# Patient Record
Sex: Male | Born: 1960 | Race: White | Hispanic: No | Marital: Married | Smoking: Former smoker
Health system: Southern US, Community
[De-identification: ages and names within clinical notes are randomized; demographics above are authoritative.]

## PROBLEM LIST (undated history)

## (undated) DIAGNOSIS — R0902 Hypoxemia: Secondary | ICD-10-CM

## (undated) DIAGNOSIS — J679 Hypersensitivity pneumonitis due to unspecified organic dust: Secondary | ICD-10-CM

## (undated) DIAGNOSIS — R079 Chest pain, unspecified: Secondary | ICD-10-CM

## (undated) DIAGNOSIS — J302 Other seasonal allergic rhinitis: Secondary | ICD-10-CM

## (undated) DIAGNOSIS — I10 Essential (primary) hypertension: Secondary | ICD-10-CM

## (undated) DIAGNOSIS — R011 Cardiac murmur, unspecified: Secondary | ICD-10-CM

## (undated) DIAGNOSIS — I341 Nonrheumatic mitral (valve) prolapse: Secondary | ICD-10-CM

## (undated) DIAGNOSIS — J8409 Other alveolar and parieto-alveolar conditions: Secondary | ICD-10-CM

## (undated) DIAGNOSIS — E781 Pure hyperglyceridemia: Secondary | ICD-10-CM

## (undated) DIAGNOSIS — H9313 Tinnitus, bilateral: Secondary | ICD-10-CM

## (undated) HISTORY — DX: Hypoxemia: R09.02

## (undated) HISTORY — DX: Tinnitus, bilateral: H93.13

## (undated) HISTORY — DX: Hypersensitivity pneumonitis due to unspecified organic dust: J67.9

## (undated) HISTORY — DX: Essential (primary) hypertension: I10

## (undated) HISTORY — DX: Pure hyperglyceridemia: E78.1

## (undated) HISTORY — DX: Nonrheumatic mitral (valve) prolapse: I34.1

## (undated) HISTORY — DX: Cardiac murmur, unspecified: R01.1

## (undated) HISTORY — DX: Chest pain, unspecified: R07.9

## (undated) HISTORY — DX: Other alveolar and parieto-alveolar conditions: J84.09

## (undated) HISTORY — DX: Other seasonal allergic rhinitis: J30.2

## (undated) HISTORY — PX: BACK SURGERY: SHX140

## (undated) HISTORY — PX: SHOULDER SURGERY: SHX246

---

## 1997-12-06 ENCOUNTER — Emergency Department (HOSPITAL_COMMUNITY): Admission: EM | Admit: 1997-12-06 | Discharge: 1997-12-06 | Payer: Self-pay | Admitting: Emergency Medicine

## 1997-12-08 ENCOUNTER — Ambulatory Visit (HOSPITAL_COMMUNITY): Admission: RE | Admit: 1997-12-08 | Discharge: 1997-12-08 | Payer: Self-pay | Admitting: Family Medicine

## 2001-04-30 ENCOUNTER — Other Ambulatory Visit: Admission: RE | Admit: 2001-04-30 | Discharge: 2001-04-30 | Payer: Self-pay | Admitting: Family Medicine

## 2002-05-10 ENCOUNTER — Encounter: Admission: RE | Admit: 2002-05-10 | Discharge: 2002-05-10 | Payer: Self-pay | Admitting: Family Medicine

## 2002-05-10 ENCOUNTER — Encounter: Payer: Self-pay | Admitting: Family Medicine

## 2010-07-05 ENCOUNTER — Emergency Department (HOSPITAL_COMMUNITY): Payer: 59

## 2010-07-05 ENCOUNTER — Inpatient Hospital Stay (HOSPITAL_COMMUNITY)
Admission: EM | Admit: 2010-07-05 | Discharge: 2010-07-09 | DRG: 198 | Disposition: A | Discharge status: Home / Self Care | Payer: 59 | Attending: Family Medicine | Admitting: Family Medicine

## 2010-07-05 DIAGNOSIS — I501 Left ventricular failure: Secondary | ICD-10-CM

## 2010-07-05 DIAGNOSIS — T7840XA Allergy, unspecified, initial encounter: Secondary | ICD-10-CM | POA: Diagnosis present

## 2010-07-05 DIAGNOSIS — Z418 Encounter for other procedures for purposes other than remedying health state: Secondary | ICD-10-CM

## 2010-07-05 DIAGNOSIS — Z2989 Encounter for other specified prophylactic measures: Secondary | ICD-10-CM

## 2010-07-05 DIAGNOSIS — Z79899 Other long term (current) drug therapy: Secondary | ICD-10-CM

## 2010-07-05 DIAGNOSIS — X58XXXA Exposure to other specified factors, initial encounter: Secondary | ICD-10-CM

## 2010-07-05 DIAGNOSIS — J984 Other disorders of lung: Secondary | ICD-10-CM | POA: Diagnosis present

## 2010-07-05 DIAGNOSIS — R0902 Hypoxemia: Secondary | ICD-10-CM | POA: Diagnosis present

## 2010-07-05 DIAGNOSIS — E781 Pure hyperglyceridemia: Secondary | ICD-10-CM

## 2010-07-05 DIAGNOSIS — J8409 Other alveolar and parieto-alveolar conditions: Principal | ICD-10-CM | POA: Diagnosis present

## 2010-07-05 DIAGNOSIS — R52 Pain, unspecified: Secondary | ICD-10-CM

## 2010-07-05 DIAGNOSIS — D72829 Elevated white blood cell count, unspecified: Secondary | ICD-10-CM | POA: Diagnosis present

## 2010-07-05 LAB — POCT I-STAT, CHEM 8
BUN: 22 mg/dL (ref 6–23)
Calcium, Ion: 1.02 mmol/L — ABNORMAL LOW (ref 1.12–1.32)
Chloride: 110 mEq/L (ref 96–112)
Creatinine, Ser: 1.4 mg/dL (ref 0.4–1.5)
Glucose, Bld: 94 mg/dL (ref 70–99)
HCT: 41 % (ref 39.0–52.0)
Hemoglobin: 13.9 g/dL (ref 13.0–17.0)
Potassium: 4 mEq/L (ref 3.5–5.1)
Sodium: 138 mEq/L (ref 135–145)
TCO2: 20 mmol/L (ref 0–100)

## 2010-07-05 LAB — URINALYSIS, ROUTINE W REFLEX MICROSCOPIC
Bilirubin Urine: NEGATIVE
Glucose, UA: NEGATIVE mg/dL
Hgb urine dipstick: NEGATIVE
Protein, ur: NEGATIVE mg/dL
Urobilinogen, UA: 0.2 mg/dL (ref 0.0–1.0)

## 2010-07-05 LAB — DIFFERENTIAL
Basophils Absolute: 0 10*3/uL (ref 0.0–0.1)
Basophils Relative: 0 % (ref 0–1)
Eosinophils Absolute: 0.1 10*3/uL (ref 0.0–0.7)
Eosinophils Relative: 1 % (ref 0–5)
Lymphocytes Relative: 9 % — ABNORMAL LOW (ref 12–46)
Lymphs Abs: 1.5 10*3/uL (ref 0.7–4.0)
Monocytes Absolute: 0.7 10*3/uL (ref 0.1–1.0)
Monocytes Relative: 5 % (ref 3–12)
Neutro Abs: 14.1 10*3/uL — ABNORMAL HIGH (ref 1.7–7.7)
Neutrophils Relative %: 85 % — ABNORMAL HIGH (ref 43–77)

## 2010-07-05 LAB — CBC
HCT: 39.4 % (ref 39.0–52.0)
Hemoglobin: 13.5 g/dL (ref 13.0–17.0)
MCH: 29 pg (ref 26.0–34.0)
MCHC: 34.3 g/dL (ref 30.0–36.0)
MCV: 84.7 fL (ref 78.0–100.0)
Platelets: 241 10*3/uL (ref 150–400)
RBC: 4.65 MIL/uL (ref 4.22–5.81)
RDW: 13.6 % (ref 11.5–15.5)
WBC: 16.5 10*3/uL — ABNORMAL HIGH (ref 4.0–10.5)

## 2010-07-05 LAB — TROPONIN I: Troponin I: 0.01 ng/mL (ref 0.00–0.06)

## 2010-07-05 LAB — SEDIMENTATION RATE: Sed Rate: 32 mm/hr — ABNORMAL HIGH (ref 0–16)

## 2010-07-05 LAB — POCT CARDIAC MARKERS
CKMB, poc: <1 ng/mL — ABNORMAL LOW (ref 1.0–8.0)
Myoglobin, poc: 107 ng/mL (ref 12–200)
Troponin i, poc: <0.05 ng/mL (ref 0.00–0.09)

## 2010-07-05 LAB — PROTIME-INR
INR: 1.02 (ref 0.00–1.49)
Prothrombin Time: 13.6 seconds (ref 11.6–15.2)

## 2010-07-05 LAB — CK TOTAL AND CKMB (NOT AT ARMC): CK, MB: 0.9 ng/mL (ref 0.3–4.0)

## 2010-07-05 LAB — BRAIN NATRIURETIC PEPTIDE: Pro B Natriuretic peptide (BNP): 39 pg/mL (ref 0.0–100.0)

## 2010-07-05 MED ORDER — IOHEXOL 300 MG/ML  SOLN
100.0000 mL | Freq: Once | INTRAMUSCULAR | Status: AC | PRN
  Administered 2010-07-05: 100 mL via INTRAVENOUS

## 2010-07-06 ENCOUNTER — Inpatient Hospital Stay (HOSPITAL_COMMUNITY): Payer: 59

## 2010-07-06 DIAGNOSIS — R0602 Shortness of breath: Secondary | ICD-10-CM

## 2010-07-06 DIAGNOSIS — J984 Other disorders of lung: Secondary | ICD-10-CM

## 2010-07-06 LAB — CBC
HCT: 39.2 % (ref 39.0–52.0)
MCHC: 33.2 g/dL (ref 30.0–36.0)
MCV: 84.3 fL (ref 78.0–100.0)
Platelets: 228 10*3/uL (ref 150–400)
RDW: 13.5 % (ref 11.5–15.5)
WBC: 7.2 10*3/uL (ref 4.0–10.5)

## 2010-07-06 LAB — CARDIAC PANEL(CRET KIN+CKTOT+MB+TROPI)
CK, MB: 0.8 ng/mL (ref 0.3–4.0)
CK, MB: 1.2 ng/mL (ref 0.3–4.0)
Total CK: 93 U/L (ref 7–232)
Troponin I: 0.01 ng/mL (ref 0.00–0.06)

## 2010-07-06 LAB — BASIC METABOLIC PANEL
BUN: 22 mg/dL (ref 6–23)
Calcium: 9.1 mg/dL (ref 8.4–10.5)
GFR calc non Af Amer: 50 mL/min — ABNORMAL LOW (ref >60)
Glucose, Bld: 102 mg/dL — ABNORMAL HIGH (ref 70–99)
Potassium: 3.7 mEq/L (ref 3.5–5.1)
Sodium: 140 mEq/L (ref 135–145)

## 2010-07-06 LAB — TSH: TSH: 0.685 u[IU]/mL (ref 0.350–4.500)

## 2010-07-06 LAB — PROCALCITONIN: Procalcitonin: <0.1 ng/mL

## 2010-07-06 LAB — RHEUMATOID FACTOR: Rhuematoid fact SerPl-aCnc: <10 IU/mL (ref <=14)

## 2010-07-07 ENCOUNTER — Inpatient Hospital Stay (HOSPITAL_COMMUNITY): Payer: 59

## 2010-07-07 LAB — CBC
HCT: 41.6 % (ref 39.0–52.0)
MCH: 28.7 pg (ref 26.0–34.0)
MCHC: 33.7 g/dL (ref 30.0–36.0)
MCV: 85.2 fL (ref 78.0–100.0)
Platelets: 236 10*3/uL (ref 150–400)
RDW: 13.4 % (ref 11.5–15.5)

## 2010-07-07 LAB — BASIC METABOLIC PANEL
CO2: 31 mEq/L (ref 19–32)
Chloride: 101 mEq/L (ref 96–112)
Sodium: 140 mEq/L (ref 135–145)

## 2010-07-07 LAB — C-REACTIVE PROTEIN: CRP: 2.2 mg/dL — ABNORMAL HIGH (ref <0.6)

## 2010-07-07 LAB — ANGIOTENSIN CONVERTING ENZYME: Angiotensin-Converting Enzyme: 16 U/L (ref 8–52)

## 2010-07-08 DIAGNOSIS — R0902 Hypoxemia: Secondary | ICD-10-CM

## 2010-07-08 DIAGNOSIS — I059 Rheumatic mitral valve disease, unspecified: Secondary | ICD-10-CM

## 2010-07-08 LAB — LEGIONELLA ANTIGEN, URINE

## 2010-07-09 DIAGNOSIS — J8409 Other alveolar and parieto-alveolar conditions: Secondary | ICD-10-CM

## 2010-07-09 DIAGNOSIS — J984 Other disorders of lung: Secondary | ICD-10-CM

## 2010-07-09 DIAGNOSIS — R0902 Hypoxemia: Secondary | ICD-10-CM

## 2010-07-09 LAB — BASIC METABOLIC PANEL
Chloride: 105 mEq/L (ref 96–112)
GFR calc non Af Amer: 58 mL/min — ABNORMAL LOW (ref >60)
Glucose, Bld: 97 mg/dL (ref 70–99)
Potassium: 3.8 mEq/L (ref 3.5–5.1)
Sodium: 136 mEq/L (ref 135–145)

## 2010-07-09 LAB — CBC
HCT: 39.9 % (ref 39.0–52.0)
Platelets: 212 10*3/uL (ref 150–400)
RBC: 4.74 MIL/uL (ref 4.22–5.81)
RDW: 13.1 % (ref 11.5–15.5)
WBC: 6.9 10*3/uL (ref 4.0–10.5)

## 2010-07-09 LAB — ANTI-NUCLEAR AB-TITER (ANA TITER): ANA Titer 1: NEGATIVE

## 2010-07-09 LAB — SJOGRENS SYNDROME-A EXTRACTABLE NUCLEAR ANTIBODY: SSA (Ro) (ENA) Antibody, IgG: 3 AU/mL (ref <30)

## 2010-07-09 LAB — ANA: Anti Nuclear Antibody(ANA): POSITIVE — AB

## 2010-07-09 LAB — SJOGRENS SYNDROME-B EXTRACTABLE NUCLEAR ANTIBODY: SSB (La) (ENA) Antibody, IgG: <1 AU/mL (ref <30)

## 2010-07-11 LAB — ANTI-NEUTROPHIL ANTIBODY

## 2010-07-11 LAB — GLOMERULAR BASEMENT MEMBRANE ANTIBODIES

## 2010-07-12 LAB — CULTURE, BLOOD (ROUTINE X 2)
Culture: NO GROWTH
Culture: NO GROWTH

## 2010-07-15 NOTE — Consult Note (Signed)
NAMERICHARDS, PHERIGO             ACCOUNT NO.:  [id number]  MEDICAL RECORD NO.:  [id number]           PATIENT TYPE:  I  LOCATION:  4702                         FACILITY:  [facility]  PHYSICIAN:  Kalman Shan, MD   DATE OF BIRTH:  Mar 14, 1961  DATE OF CONSULTATION:  07/06/2010 DATE OF DISCHARGE:                                CONSULTATION   CONSULT REQUESTING ATTENDING:  Leighton Roach McDiarmid, MD of Family Practice Teaching Service.  Consult requested for: 1. Diffuse parenchymal lung disease. 2. Hypoxemia. 3. Interstitial lung disease. 4. Alveolar pneumonopathy, not otherwise specified.  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  He was spent some time in a clean environment on the third day of his stay, developed acute onset of nausea and vomiting, went to the Urgent Care, was told it was food poisoning and "this has been going around [place]."  He took the fourth day of his conference off, subsequently felt better, and then returned back to [place].  Since his return, he has been having a raspy voice and a sore throat (of note, he has been having this for several weeks even prior to going [place]).  Subsequently a week ago, developed what he thought was "bronchitis" with nonproductive cough, worsening sore throat and raspy voice, and he felt like it "went down my chest."  Went to Urgent Care, was given a course of Z-Pak and 7 days of prednisone, but this does not make him feel better.  In the last 3 days, he had progressive dyspnea, class III levels, and severe in intensity, relieved by rest, associated with palpitations.  He also admitted having some chills and rigors and feeling cold and diaphoresis and therefore was admitted to the hospital.  In the hospital, his white count is 16500.  His CT scan of the chest ruled out pulmonary emboli, but showed  bilateral diffuse airspace disease consistent with ground-glass opacities and crackles on his exam. He had some hypoxemia initially he says, but subsequently he felt spontaneously better and has remained afebrile.  He is wishing to go home but Pulmonary Critical Care consult has been called to evaluate for etiology and management.  ALLERGIES:  No known drug allergies.  HOME MEDICATIONS:  Vitamin, Allegra, TriCor.  PAST MEDICAL HISTORY:  Significant for hypertriglyceridemia.  PAST SURGICAL HISTORY:  Negative.  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  Denies tobacco use.  Drinks occasional alcohol.  No drug use.  HIV risk factors could not be ascertained because his wife was present with him.  He admits to PPD skin testing 25 years ago when he was in the [employer] and this was negative.  OCCUPATIONAL HISTORY:  Denies any TB exposure.  Denies having any mold in the house.  Denies having birds.  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  He denies asbestos exposure.  Denies any nitrofurantoin or amiodarone intake.  TRAVEL HISTORY:  Traveled to [place] but that does not recollect any exposure to intense soil excavation.  Does not admit to any travel history to [facility].  FAMILY HISTORY:  No history of cancer, diabetes mellitus, or MI.  The patient's father had strokes.  REVIEW OF SYSTEMS:  As per history of present illness.  PAST HISTORY:  As the above, but otherwise 13-point review of systems is negative.  PHYSICAL EXAMINATION:  VITAL SIGNS:  Temperature 98.0, pulse of 78, respiratory rate of 18, blood pressure 92/49, saturation 94% on room air. GENERAL:  Extremely fit male, looks much younger than his stated age, in no  distress, looks well. CENTRAL NERVOUS SYSTEM:  Alert and oriented x3.  Speech is normal. Cranial nerves are normal.  No focal deficits. MUSCULOSKELETAL:  Normal.  No joint swelling.  No cyanosis, no clubbing, and no edema. HEENT:  Extraocular movements are intact.  Pupils equal and reactive to light. NECK:  Shows no JVD.  No thyromegaly.  No adenopathy. CARDIOVASCULAR:  Regular rate and rhythm.  No murmurs.  No gallops. CHEST:  External chest is normal. LUNGS:  Bilateral bibasilar crackles, otherwise clear, right-sided crackles are greater than the left-sided crackles.  No distress.  Speaks in full sentences.  No accessory muscle use.  No cyanosis. EXTREMITIES:  No cyanosis, no clubbing, and no edema.  LABORATORY EVALUATION: 1. White count is 16,500, no eosinophilia and neutrophils were 85%. 2. Urinalysis within normal limits. 3. BNP normal.  CT chest as described above diffuse parenchymal lung disease with ground- glass opacities.  INR 1.02.  ASSESSMENT AND PLAN: 1. Diffuse parenchymal lung disease. 2. Interstitial lung disease. 3. Alveolar pneumonopathy, not otherwise specified.  IMPRESSION: 1. Most likely this is viral pneumonitis.  Other possibility is     bacterial pneumonitis. 2. Autoimmune lung disease. 3. Possible Pneumocystis carinii pneumonia but if his human     immunodeficiency virus is negative, this is not possible. 4. This is not tuberculosis unless his human immunodeficiency virus is     positive. 5. Acute sarcoidosis is a possibility, but I doubt this. 6. Acute histoplasma lung disease is possible, but this is more common     with coxae. 7. Other differential diagnoses includes acute interstitial     pneumonitis (AIP), acute eosinophilic pneumonia (AEP),     bronchiolitis obliterans-organizing pneumonia; however, these are     all very rare. 8. Possibility is diastolic heart failure, but his BNP is normal.  RECOMMENDATIONS: 1. I agree with the labs  done so far.  I agree with antibiotics. 2. I have ordered additional autoimmune markers. 3. I have ordered procalcitonin levels.  If this is high, this would     trace index of suspicion towards bacterial process. 4. Check urinary histo antigen. 5. Check serum histo antibody. 6. Repeat chest x-ray to see progress. 7. Perform exertional desaturation on room air. 8. He is wishing to go home if his exertion pulse ox is normal and if     HIV is negative, I think he can go home. 9. Outpatient follow up with Dr. Kalman Shan on July 24, 2010, at     3:15 p.m. 10.Please avoid steroids. 11.If this does not improve subjecting to bronchoscopy with biopsy or     VATS lung biopsy, therefore it is really important that steroids     avoid until and unless there is some positive results. 12.Pulmonary  Critical Care will continue to follow this patient.     Kalman Shan, MD     MR/MEDQ  D:  07/06/2010  T:  07/07/2010  Job:  [id number]  cc:   Harrel Lemon. Merla Riches, M.D.  Electronically Signed by Kalman Shan MD on 07/15/2010 08:57:08 PM

## 2010-07-16 ENCOUNTER — Telehealth: Payer: Self-pay | Admitting: Internal Medicine

## 2010-07-16 ENCOUNTER — Encounter: Payer: Self-pay | Admitting: Internal Medicine

## 2010-07-16 DIAGNOSIS — J849 Interstitial pulmonary disease, unspecified: Secondary | ICD-10-CM

## 2010-07-16 NOTE — Telephone Encounter (Signed)
Spoke with pt.  He is still c/o SOB, but states that he is no more SOB than usual for him.  He has a pulse ox at home and has been checking sats.  Today while he was walking he checked and his sats were in the 70's.  Once he sat down they increased to above 90.  He states that he was on o2 while in the hospital, but not sent home with it.  He states that he feels fine and does not want to go back to the hospital.  Will forward msg to MR. Please advise thanks

## 2010-07-16 NOTE — Telephone Encounter (Signed)
Please have him come in tomorrow. AS discussed we can start early at 13:15 but he needs ct chest without contrast (evaluate ILD)l before I see him. IF worse, needs to go to eR. He can do CT chest today/tomorrow

## 2010-07-16 NOTE — Telephone Encounter (Signed)
Pt set to see MR tomorrow at 1:15pm. CT order sent to [facility]. Carron Curie, CMA

## 2010-07-17 ENCOUNTER — Ambulatory Visit (INDEPENDENT_AMBULATORY_CARE_PROVIDER_SITE_OTHER)
Admission: RE | Admit: 2010-07-17 | Discharge: 2010-07-17 | Disposition: A | Discharge status: Home / Self Care | Payer: 59 | Source: Ambulatory Visit | Attending: Internal Medicine | Admitting: Internal Medicine

## 2010-07-17 ENCOUNTER — Encounter: Payer: Self-pay | Admitting: Internal Medicine

## 2010-07-17 ENCOUNTER — Ambulatory Visit (INDEPENDENT_AMBULATORY_CARE_PROVIDER_SITE_OTHER): Payer: 59 | Admitting: Internal Medicine

## 2010-07-17 ENCOUNTER — Encounter: Payer: Self-pay | Admitting: *Deleted

## 2010-07-17 VITALS — BP 110/70 | HR 82 | Temp 98.4°F | Ht 69.0 in | Wt 189.2 lb

## 2010-07-17 DIAGNOSIS — R0902 Hypoxemia: Secondary | ICD-10-CM

## 2010-07-17 DIAGNOSIS — J84115 Respiratory bronchiolitis interstitial lung disease: Secondary | ICD-10-CM

## 2010-07-17 DIAGNOSIS — J849 Interstitial pulmonary disease, unspecified: Secondary | ICD-10-CM

## 2010-07-17 NOTE — Patient Instructions (Signed)
Please have full breathing test called FULL PFT  - do it this week at [place] [facility] [place]  - after this is done, call me and I will advise if you need challenge test for asthma or not I am referring you to see Dr Teressa Lower cardiologist for consideration of right heart cath I am setting up oxygen use for you - please wear it at night and with exertion past 50-100 feet IF you are getting worse, call me or the office immediately Cancel eye surgery and air travel

## 2010-07-17 NOTE — Progress Notes (Signed)
SATURATION QUALIFICATIONS:  Patient Saturations on Room Air at Rest = 91%  Patient Saturations on Room Air while Ambulating = 85%  Patient Saturations on 2 Liters of oxygen while Ambulating = 93%   

## 2010-07-18 ENCOUNTER — Encounter: Payer: Self-pay | Admitting: Internal Medicine

## 2010-07-18 ENCOUNTER — Encounter (INDEPENDENT_AMBULATORY_CARE_PROVIDER_SITE_OTHER): Payer: 59 | Admitting: Internal Medicine

## 2010-07-18 DIAGNOSIS — R0902 Hypoxemia: Secondary | ICD-10-CM

## 2010-07-18 NOTE — Patient Instructions (Addendum)
Start Home O2 I am referring you to Dr. Teressa Lower for right heart cath Have full PFTs  - call me with these results to discuss next step Followup based on above If you get worse, call us or come sooner

## 2010-07-18 NOTE — Progress Notes (Signed)
Subjective:    Patient ID: Adam Chang, male    DOB: 05-27-1960, 50 y.o.   MRN: [id number]  HPI HPI  Followup ILD and hypoxemia (see overview for details) following hospitalization mid-March 2012.  Acute visit today. He called stating that he was not gettting any better. Very dyspneic walking < 200 feet. Gets exhausted. Associated fatigue, diaphoresis present. Also, has random tingling down his hands and feet. OVernight pulse ox at home dropping to 70s%. Associated dry cough is ongoing. Feels chest tightness. Notices hypoxemia improves with deep breath and resting. Denies associated chest pain, edema, nausea, vomit, clubbing, fever, chills, hemoptysis, weight loss or wheezing. Currently on short term disability. Occupational exposures reviewed again: again denies. Reviewed alcohol use: drinks 2 shots over weekend of [employer]. Currently holding off due to ongoing illness but wife states he is very keen to drink again. Walked him in office and he desaturated to 88% after walking 155 feet x 2 laps. However, suprisingly the CT CHEST today shows complete clearance of interstitial edema / GGO. Spirometry in office today (reviewed graph) - completely normal  Past Medical History  Diagnosis Date  . Hypertriglyceridemia   . Seasonal allergies   . Hypoxemia      Family History  Problem Relation Age of Onset  . Stroke Father 71     History   Social History  . Marital Status: Married    Spouse Name: N/A    Number of Children: N/A  . Years of Education: N/A   Occupational History  . electrician    Social History Main Topics  . Smoking status: Former Smoker -- 0.5 packs/day for 8 years    Types: Cigarettes    Quit date: 04/22/1985  . Smokeless tobacco: Not on file  . Alcohol Use: 1.2 oz/week    2 Shots of liquor per week  . Drug Use: No  . Sexually Active: Not on file   Other Topics Concern  . Not on file   Social History Narrative   Denies any TB exposure.  Denies having any  mold  in the house.  Denies having birds.  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  He denies asbestos exposure.  Denies any  nitrofurantoin or amiodarone intake.      No Known Allergies   Outpatient Prescriptions Prior to Visit  Medication Sig Dispense Refill  . fexofenadine (ALLEGRA) 180 MG tablet Take 180 mg by mouth daily.        . Multiple Vitamins-Minerals (MULTIVITAMIN WITH MINERALS) tablet Take 1 tablet by mouth daily.        Marland Kitchen omeprazole (PRILOSEC) 40 MG capsule Take 40 mg by mouth daily.        Marland Kitchen acetaminophen (TYLENOL) 650 MG CR tablet Take 650 mg by mouth every 8 (eight) hours as needed.        Marland Kitchen azithromycin (ZITHROMAX) 250 MG tablet Take 2 tablets by mouth on day 1, followed by 1 tablet by mouth daily for 4 days.        . fenofibrate (TRICOR) 145 MG tablet Take 145 mg by mouth daily.             Review of Systems   Constitutional:   No  weight loss, night sweats,  Fevers, chills, fatigue, lassitude. HEENT:   No headaches,  Difficulty swallowing,  Tooth/dental problems,  Sore throat,                No sneezing, itching, ear ache, nasal congestion, post nasal drip,   CV:  No chest pain,  Orthopnea, PND, swelling in lower extremities, anasarca, dizziness, palpitations  GI  No heartburn, indigestion, abdominal pain, nausea, vomiting, diarrhea, change in bowel habits, loss of appetite  Resp:  No coughing up of blood.  No change in color of mucus.  No wheezing.  No chest wall deformity  Skin: no rash or lesions.  GU: no dysuria, change in color of urine, no urgency or frequency.  No flank pain.  MS:  No joint pain or swelling.  No decreased range of motion.  No back pain.  Psych:  No change in mood or affect. No depression or anxiety.  No memory loss.      Objective:   Physical Exam  Nursing note and vitals reviewed. Constitutional: He is oriented to person, place, and time. He appears well-developed and well-nourished. No distress.  HENT:  Head: Normocephalic and atraumatic.  Right Ear: External ear normal.  Left Ear: External ear normal.  Mouth/Throat: Oropharynx is clear and moist. No oropharyngeal exudate.  Eyes: Conjunctivae and EOM are normal. Pupils are equal, round, and reactive to light. Right eye exhibits no discharge. Left eye exhibits no discharge. No scleral icterus.  Neck: Normal range of motion. Neck supple. No JVD present. No tracheal deviation present. No thyromegaly present.  Cardiovascular: Normal rate, regular rhythm and intact distal pulses.  Exam reveals no gallop and no friction rub.   No murmur heard. Pulmonary/Chest: Effort normal and breath sounds normal. No respiratory distress. He has no wheezes. He has no rales. He exhibits no tenderness.       One spider angioma in left infra clavicular area +  Abdominal: Soft. Bowel sounds are normal. He exhibits no distension and no mass. There is no tenderness. There is no rebound and no guarding.       Liver edge palpable +  Musculoskeletal: Normal range of motion. He exhibits no edema and no tenderness.  Lymphadenopathy:    He has no cervical adenopathy.  Neurological: He is alert and oriented to person, place, and time. He has normal reflexes. No cranial nerve deficit. Coordination normal.  Skin: Skin is warm and dry. No rash noted. He is not diaphoretic. No erythema. No pallor.  Psychiatric: He has a normal mood and affect. His behavior is normal. Judgment and thought content normal.        Assessment & Plan:

## 2010-07-18 NOTE — Progress Notes (Signed)
Subjective:    Patient ID: Adam Chang, male    DOB: [birth year], 50 y.o.   MRN: [id number]  HPI Followup ILD and hypoxemia (see overview for details) following hospitalization mid-March 2012.  Acute visit today. He called stating that he was not gettting any better. Very dyspneic walking < 200 feet. Gets exhausted. Associated fatigue, diaphoresis present. Also, has random tingling down his hands and feet. OVernight pulse ox at home dropping to 70s%. Associated dry cough is ongoing. Feels chest tightness. Notices hypoxemia improves with deep breath and resting.  Denies associated chest pain, edema, nausea, vomit, clubbing, fever, chills, hemoptysis, weight loss or wheezing. Currently on short term disability. Occupational exposures reviewed again: again denies. Reviewed alcohol use: drinks 2 shots over weekend of [employer]. Currently holding off due to ongoing illness but wife states he is very keen to drink again. Walked him in office and he desaturated to 88% after walking 155 feet x 2 laps. However, suprisingly the CT CHEST today shows complete clearance of interstitial edema / GGO.  Spirometry in office today (reviewed graph) - completely normal  Past Medical History  Diagnosis Date  . Hypertriglyceridemia   . Seasonal allergies   . Hypoxemia      Family History  Problem Relation Age of Onset  . Stroke Father 55     History   Social History  . Marital Status: Married    Spouse Name: N/A    Number of Children: N/A  . Years of Education: N/A   Occupational History  . electrician    Social History Main Topics  . Smoking status: Former Smoker -- 0.5 packs/day for 8 years    Types: Cigarettes    Quit date: 04/22/1985  . Smokeless tobacco: Not on file  . Alcohol Use: 1.2 oz/week    2 Shots of liquor per week  . Drug Use: No  . Sexually Active: Not on file   Other Topics Concern  . Not on file   Social History Narrative   Denies any TB exposure.  Denies having any mold   in the house.  Denies having birds.  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  He denies asbestos exposure.  Denies any  nitrofurantoin or amiodarone intake.      No Known Allergies   Outpatient Prescriptions Prior to Visit  Medication Sig Dispense Refill  . fexofenadine (ALLEGRA) 180 MG tablet Take 180 mg by mouth daily.        . Multiple Vitamins-Minerals (MULTIVITAMIN WITH MINERALS) tablet Take 1 tablet by mouth daily.        Marland Kitchen omeprazole (PRILOSEC) 40 MG capsule Take 40 mg by mouth daily.            Review of Systems   Constitutional:   No  weight loss, night sweats,  Fevers, chills, fatigue, lassitude. HEENT:   No headaches,  Difficulty swallowing,  Tooth/dental problems,  Sore throat,                No sneezing, itching, ear ache, nasal congestion, post nasal drip,   CV:  No chest pain,  Orthopnea, PND, swelling in lower extremities, anasarca, dizziness, palpitations  GI  No heartburn, indigestion, abdominal pain, nausea, vomiting, diarrhea, change in bowel habits, loss  of appetite  Resp: No excess mucus, no productive cough,  No non-productive cough,  No coughing up of blood.  No change in color of mucus.  No wheezing.  No chest wall deformity  Skin: no rash or lesions.  GU: no dysuria, change in color of urine, no urgency or frequency.  No flank pain.  MS:  No joint pain or swelling.  No decreased range of motion.  No back pain.  Psych:  No change in mood or affect. No depression or anxiety.  No memory loss.   Objective:   Physical Exam  Nursing note and vitals reviewed. Constitutional: He is oriented to person, place, and time. He appears well-developed and well-nourished. No distress.  HENT:  Head: Normocephalic and atraumatic.  Right Ear: External ear  normal.  Left Ear: External ear normal.  Mouth/Throat: Oropharynx is clear and moist. No oropharyngeal exudate.  Eyes: Conjunctivae and EOM are normal. Pupils are equal, round, and reactive to light. Right eye exhibits no discharge. Left eye exhibits no discharge. No scleral icterus.  Neck: Normal range of motion. Neck supple. No JVD present. No tracheal deviation present. No thyromegaly present.  Cardiovascular: Normal rate, regular rhythm and intact distal pulses.  Exam reveals no gallop and no friction rub.   No murmur heard. Pulmonary/Chest: Effort normal and breath sounds normal. No respiratory distress. He has no wheezes. He has no rales. He exhibits no tenderness.       1 x spider angioma on chest on left infra clavicular area  Abdominal: Soft. Bowel sounds are normal. He exhibits no distension and no mass. There is no tenderness. There is no rebound and no guarding.       Liver edge palpable  Musculoskeletal: Normal range of motion. He exhibits no edema and no tenderness.  Lymphadenopathy:    He has no cervical adenopathy.  Neurological: He is alert and oriented to person, place, and time. He has normal reflexes. No cranial nerve deficit. Coordination normal.  Skin: Skin is warm and dry. No rash noted. He is not diaphoretic. No erythema. No pallor.  Psychiatric: He has a normal mood and affect. His behavior is normal. Judgment and thought content normal.           Assessment & Plan:

## 2010-07-18 NOTE — Assessment & Plan Note (Signed)
Complex case. CT shows resolution of interstitial edema. Exam is normal except one spider angioma and palpation of liver edge but alcohol history is underwhelming. I am intrigued why he still shows hypoxemia (atleast on exertion).   PLAN - Will get full PFTs to asses diffusion. Have discussed with Dr. Jeffie Pollock and will get right heart cath. - If these are normal, will move to get methacholine challenge test, assess for R -> L shunt and probe more into potential liver disease. - Will also call [facility] and confirm negativity for anca and gbm antibody. - Followup based on above.  - Also, start O2 today

## 2010-07-18 NOTE — Assessment & Plan Note (Addendum)
Hypoxemia - Brand Males, MD  Complex case. CT shows resolution of interstitial edema. Exam is normal except one spider angioma and palpation of liver edge but alcohol history is underwhelming. I am intrigued why he still shows hypoxemia (atleast on exertion).  PLAN  - Will get full PFTs to asses diffusion. Have discussed with Dr. Jeffie Pollock and will get right heart cath.  - If these are normal, will move to get methacholine challenge test, assess for R -> L shunt and probe more into potential liver disease.  - Will also call [facility] and confirm negativity for anca and gbm antibody.  - Followup based on above.  - Also, start O2 today

## 2010-07-19 ENCOUNTER — Ambulatory Visit (HOSPITAL_COMMUNITY)
Admission: RE | Admit: 2010-07-19 | Discharge: 2010-07-19 | Disposition: A | Discharge status: Home / Self Care | Payer: 59 | Source: Ambulatory Visit | Attending: Internal Medicine | Admitting: Internal Medicine

## 2010-07-19 ENCOUNTER — Inpatient Hospital Stay (HOSPITAL_COMMUNITY)
Admission: RE | Admit: 2010-07-19 | Discharge: 2010-07-19 | Disposition: A | Discharge status: Home / Self Care | Payer: 59 | Source: Ambulatory Visit | Attending: Internal Medicine | Admitting: Internal Medicine

## 2010-07-19 ENCOUNTER — Encounter: Payer: Self-pay | Admitting: Internal Medicine

## 2010-07-19 ENCOUNTER — Encounter: Payer: Self-pay | Admitting: *Deleted

## 2010-07-19 ENCOUNTER — Ambulatory Visit (INDEPENDENT_AMBULATORY_CARE_PROVIDER_SITE_OTHER): Payer: 59 | Admitting: Internal Medicine

## 2010-07-19 VITALS — BP 118/82 | HR 70 | Resp 16 | Ht 68.0 in | Wt 187.4 lb

## 2010-07-19 DIAGNOSIS — R0602 Shortness of breath: Secondary | ICD-10-CM | POA: Insufficient documentation

## 2010-07-19 DIAGNOSIS — R079 Chest pain, unspecified: Secondary | ICD-10-CM | POA: Insufficient documentation

## 2010-07-19 DIAGNOSIS — R0902 Hypoxemia: Secondary | ICD-10-CM

## 2010-07-19 DIAGNOSIS — R0789 Other chest pain: Secondary | ICD-10-CM

## 2010-07-19 DIAGNOSIS — E781 Pure hyperglyceridemia: Secondary | ICD-10-CM | POA: Insufficient documentation

## 2010-07-19 LAB — CBC WITH DIFFERENTIAL/PLATELET
Basophils Relative: 0.7 % (ref 0.0–3.0)
Eosinophils Absolute: 0.3 10*3/uL (ref 0.0–0.7)
Eosinophils Relative: 2.8 % (ref 0.0–5.0)
Hemoglobin: 14.1 g/dL (ref 13.0–17.0)
Lymphocytes Relative: 13.9 % (ref 12.0–46.0)
MCHC: 34.3 g/dL (ref 30.0–36.0)
MCV: 85.6 fl (ref 78.0–100.0)
Monocytes Absolute: 0.7 10*3/uL (ref 0.1–1.0)
Neutro Abs: 6.9 10*3/uL (ref 1.4–7.7)
RBC: 4.81 Mil/uL (ref 4.22–5.81)
WBC: 9.2 10*3/uL (ref 4.5–10.5)

## 2010-07-19 LAB — BLOOD GAS, ARTERIAL
Bicarbonate: 24.6 mEq/L — ABNORMAL HIGH (ref 20.0–24.0)
Drawn by: 122601
Drawn by: 122601
FIO2: 0.21 %
FIO2: 0.21 %
O2 Saturation: 93.4 %
Patient temperature: 98.6
TCO2: 25.7 mmol/L (ref 0–100)
pCO2 arterial: 37.2 mmHg (ref 35.0–45.0)
pCO2 arterial: 38.7 mmHg (ref 35.0–45.0)
pH, Arterial: 7.436 (ref 7.350–7.450)
pO2, Arterial: 66.4 mmHg — ABNORMAL LOW (ref 80.0–100.0)

## 2010-07-19 LAB — PROTIME-INR: INR: 1 ratio (ref 0.8–1.0)

## 2010-07-19 LAB — BASIC METABOLIC PANEL
Chloride: 106 mEq/L (ref 96–112)
Potassium: 4.4 mEq/L (ref 3.5–5.1)

## 2010-07-19 NOTE — Assessment & Plan Note (Signed)
We walked him around the clinic today with his pulse ox and ours. On our pulse ox sats stayed 95% with very brisk walking (occasionally would lose signal and drop to 91%. Hr maxed at 105). Simultaneously his pulse ox dropped to 81%. I tried his pulse ox on my finger and sats stayed 97% but there was no change in my HR with vigorous walking. At this point, given all negative serologies and clearing infiltrates, my suspicion is that he had a viral pneumonitis which is now resolving. I suspect hypoxemia is artifactual. On exam there is no evidence of RH strain or PAH. Agree with proceeding with PFTs with DLCO as well as exercise ABG. Given chest pressure and concern over ongoing dyspnea will proceed with R and L heart cath to assess pulmonary pressures, do shunt run and assess coronaries. I discussed this with Dr. Marchelle Gearing.   Total time spent > 1 hour.

## 2010-07-19 NOTE — Progress Notes (Signed)
Subjective:    Patient ID: Adam Chang, male    DOB: August 17, 1960, 50 y.o.   MRN: [id number]   HPI: Adam Chang is a 49 y/o male with a h/o hypertriglyceridemia and remote tobacco use (quit 25 years ago). No h/o known cardiopulmonary disease.   In January started with a vague cough. Then took a trip to [place] in February for a training class. While there had GI illness with n/v/diarrhea. (did not aspirate vomitus) Recovered from that. Then treated by PCP for bronchitis.   In March admitted to [facility] for dyspnea and significant hypoxemia. Chest CT negative for PE but with diffuse hazy opacities. BNP 69. Echo EF 55-60% normal RV mild MR. Treated with antibiotics and discharged home.  Since d/c energy slightly improved by still dyspneic on occasion. He borrowed a pulse oximeter from a friend and notes hypoxemia and tachycardia on minimal exertion frequent desats down to 70-80s with HRs 140. Saw Dr. Chase Caller yesterday. Hd repeat chest CT (which I reviewed) with complete clearance of interstitial edema / GGO. Spirometry (reviewed graph) - completely normal. Has repeat PFTs with DLCO scheduled for today. They walked him in office and he desaturated to 88% (on their machine) after walking 155 feet x 2 laps. Started on home O2 and referred to me for RHC.   Hospital records reviewed at length:  ANA, ANCA, HIV , [place]-70, RA, anti-GBM, Histo, TB, ACE all neg. ESR 32. No eosinophilia.   Denies fevers, chills, rash, edema, orthopnea or PND.  Does get occasional exertional chest tightness.   ROS: All other systems normal except as listed in the HPI and Problem List.     Past Medical History  Diagnosis Date  . Hypertriglyceridemia   . Seasonal allergies   . Hypoxemia      Family History  Problem Relation Age of Onset  . Stroke Father 51     History   Social History  . Marital Status: Married    Spouse Name: N/A    Number of Children: N/A  . Years of Education: N/A   Occupational History  .  electrician    Social History Main Topics  . Smoking status: Former Smoker -- 0.5 packs/day for 8 years    Types: Cigarettes    Quit date: 04/22/1985  . Smokeless tobacco: Not on file  . Alcohol Use: 1.2 oz/week    2 Shots of liquor per week  . Drug Use: No  . Sexually Active: Not on file   Other Topics Concern  . Not on file   Social History Narrative   Denies any TB exposure.  Denies having any mold  in the house.  Denies having birds.  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  He denies asbestos exposure.  Denies any  nitrofurantoin or amiodarone intake.      No Known Allergies   Outpatient Prescriptions Prior to Visit  Medication Sig Dispense Refill  . fexofenadine (ALLEGRA) 180 MG tablet Take 180 mg by mouth daily.        . Multiple Vitamins-Minerals (MULTIVITAMIN WITH MINERALS) tablet Take 1 tablet by mouth daily.        Marland Kitchen azithromycin (ZITHROMAX) 250 MG tablet Take 2 tablets by mouth on day 1, followed by 1 tablet by mouth  daily for 4 days.      Marland Kitchen omeprazole (PRILOSEC) 40 MG capsule Take 40 mg by mouth daily.        . TRICOR 145 MG tablet          Objective:   Physical Exam  Filed Vitals:   07/19/10 0934  BP: 118/82  Pulse: 70  Resp: 16   Nursing note and vitals reviewed. General:  Well appearing. No resp difficulty HEENT: normal Neck: supple. no JVD. Carotids 2+ bilat; no bruits. No lymphadenopathy or thryomegaly appreciated. Cor: PMI nondisplaced. Regular rate & rhythm. No rubs, gallops or murmurs. Lungs: clear Abdomen: soft, nontender, nondistended. No hepatosplenomegaly. No bruits or masses. Good bowel sounds. Extremities: no cyanosis, clubbing, rash, edema Neuro: alert & orientedx3, cranial nerves grossly intact. moves all 4 extremities w/o  difficulty. Affect pleasant  ECG: NSR 70 No ST-T wave abnormalities.    Assessment & Plan:

## 2010-07-19 NOTE — Patient Instructions (Signed)
Labs today Your physician has requested that you have a cardiac catheterization. Cardiac catheterization is used to diagnose and/or treat various heart conditions. Doctors may recommend this procedure for a number of different reasons. The most common reason is to evaluate chest pain. Chest pain can be a symptom of coronary artery disease (CAD), and cardiac catheterization can show whether plaque is narrowing or blocking your heart's arteries. This procedure is also used to evaluate the valves, as well as measure the blood flow and oxygen levels in different parts of your heart. For further information please visit www.cardiosmart.org. Please follow instruction sheet, as given.   

## 2010-07-19 NOTE — H&P (Signed)
NAMETOMASZ, STEEVES             ACCOUNT NO.:  [id number]  MEDICAL RECORD NO.:  [id number]           PATIENT TYPE:  E  LOCATION:  [facility]                         FACILITY:  [facility]  PHYSICIAN:  Leighton Roach Keondre Markson, M.D.DATE OF BIRTH:  Mar 17, 1961  DATE OF ADMISSION:  07/05/2010 DATE OF DISCHARGE:                             HISTORY & PHYSICAL   PRIMARY CARE PROVIDER:  Dr. Harrel Lemon. Merla Riches, MD, at [facility].  CHIEF COMPLAINT:  Shortness of breath.  HISTORY OF PRESENT ILLNESS:  This is a 50 year old man who presents with shortness of breath.  He states that about 1 month ago, he was traveling in [place].  He had a GI bug.  He says that at that time he had vomiting and diarrhea.  He saw doctor there and got Zofran and Imodium and then he slept for about a day and then he flew home.  He says since that time he has been feeling bad.  He says that he has had a nonproductive cough and felt like his throat was raspy.  About a week ago, he was treated for bronchitis at [facility] with prednisone and a Z-Pak, this did not help him feel better.  Over the past 4 days, he has had progressive shortness of breath and he also complains that he has had chest pain with deep inspiration.  He says he feels short of breath going up a flight of steps, but does not feel chest pain going up a flight of steps just when he takes a deep breath.  The patient also endorses having chills at nighttime.  He says he feels cold like he has never been cold before and then he says this morning, he was sweaty at work.  His hands sweat, dripping off his hands.  ALLERGIES:  No known drug allergies.  MEDICATIONS:  Vitamins, Allegra, and TriCor.  PAST MEDICAL HISTORY:  Significant for hypertriglyceridemia disease.  PAST SURGICAL HISTORY:  Negative.  SOCIAL HISTORY:  The patient lives with his wife.  He is an Product manager.  He denies tobacco use.  He drinks alcohol occasionally. No drug  use.  FAMILY HISTORY:  No history of cancer, diabetes mellitus, or an MI.  The patient's father had strokes.  REVIEW OF SYSTEMS:  GENERAL:  Positive for chills and sweats.  Negative for appetite change or weight loss.  Negative for headache or sore throat.  Positive for chest pain with deep inspiration.  Positive for palpitations.  Positive for cough and dyspnea.  Negative for sputum or hemoptysis.  Negative for abdominal pain.  Negative for dysuria. Negative for rash.  Negative for malaise.  Negative for arthralgias. Negative for dizziness.  Negative for petechia.  Negative for polyuria. The patient denies other travel.  He denies any toxin inhalation, denies any mold inhalation.  PHYSICAL EXAMINATION:  VITAL SIGNS:  Temperature 98.9, pulse 87, respiratory rate 18, blood pressure 124/90, pulse ox 96% on 2 liters nasal cannula.  GENERAL:  No acute distress. HEAD, EYES, EARS, NOSE, AND THROAT:  Extraocular movements are intact. Pupils are equal, round, and reactive to light.  Oral mucosa is moist. NECK:  Shows no JVD.  No thyromegaly.  No adenopathy. CARDIOVASCULAR:  Regular rate and rhythm.  No murmurs, rubs, or gallops. LUNGS:  Bibasilar crackles, otherwise clear. ABDOMEN:  Positive bowel sounds.  Soft and nontender. EXTREMITIES:  No edema and no calf tenderness. NEUROLOGIC:  The patient is alert and oriented x3.  No focal deficits. MUSCULOSKELETAL:  The patient has no joint swelling.  LABORATORY DATA AND STUDIES: 1. An i-STAT, ionized calcium 20, hemoglobin 13.9, hematocrit 41.0,     sodium 138, potassium 4.0, chloride 110, glucose 94, BUN 22,     creatinine 1.4. 2. CBC, white blood cell count 16.5, hemoglobin 13.5, hematocrit 39.4,     platelets 241, absolute neutrophils 14.1, absolute lymphocytes 1.5,     absolute monocytes 0.7, absolute eosinophils 0.1. 3. Urinalysis was within normal limits. 4. BNP is 39. 5. Point-of-care cardiac markers, CK-MB less than 1.0, troponin-I  less     than 0.05, myoglobin 107. 6. Protime 3.6, INR 1.02, a PTT is 31.  RADIOLOGY:  A CT angiogram of the chest shows slightly dependent haziness in both lungs, might represent pulmonary edema.  Vascular congestion.  EKG is normal sinus rhythm, nonspecific T-wave abnormalities.  ASSESSMENT AND PLAN:  This is a 50 year old man who presents with shortness of breath. 1. Shortness of breath.  The patient is hypoxic on exam and requiring     oxygen.  We will admit him to a telemetry bed and continue oxygen     as needed.  His CT scan showed questionable pulmonary edema.  On     exam, he does have bibasilar crackles in his lungs, however, he has     no lower extremity edema and his BNP is normal.  He was given a     dose of Lasix in the emergency department.  We will monitor his     in's and out's and plan on giving him another dose in the morning     to see if he improves with diuresis.  It is possible that the     patient may have a post viral cardiomyopathy.  This is rare.  We     will check a ESR and CRP and to evaluate for any inflammatory     process.  We will also plan to get an echocardiogram to evaluate     for any heart failure.  We will also cycle cardiac enzymes to rule     out acute coronary syndrome as the cause of shortness of breath. 2. Leukocytosis.  The CBC shows an elevated white blood cell count,     but the patient received prednisone recently.  We will hold     antibiotics for now and monitor his white blood cell count. 3. FEN/GI.  We will Hep-Lock his IV and give him a heart healthy diet. 4. Deep vein thrombosis prophylaxis.  We will give heparin 5000 units     subcu t.i.d. 5. Disposition is pending clinical improvement.    ______________________________ Ardyth Gal, MD   ______________________________ Leighton Roach Camber Ninh, M.D.    CR/MEDQ  D:  07/05/2010  T:  07/06/2010  Job:  [id number]  Electronically Signed by Ardyth Gal MD on 07/18/2010  04:31:42 PM Electronically Signed by Acquanetta Belling M.D. on 07/19/2010 04:49:32 PM

## 2010-07-20 ENCOUNTER — Inpatient Hospital Stay (HOSPITAL_BASED_OUTPATIENT_CLINIC_OR_DEPARTMENT_OTHER)
Admission: RE | Admit: 2010-07-20 | Discharge: 2010-07-20 | Disposition: A | Discharge status: Home / Self Care | Payer: 59 | Source: Ambulatory Visit | Attending: Internal Medicine | Admitting: Internal Medicine

## 2010-07-20 DIAGNOSIS — R0602 Shortness of breath: Secondary | ICD-10-CM

## 2010-07-20 DIAGNOSIS — R0989 Other specified symptoms and signs involving the circulatory and respiratory systems: Secondary | ICD-10-CM | POA: Insufficient documentation

## 2010-07-20 DIAGNOSIS — R0902 Hypoxemia: Secondary | ICD-10-CM | POA: Insufficient documentation

## 2010-07-20 DIAGNOSIS — R0609 Other forms of dyspnea: Secondary | ICD-10-CM | POA: Insufficient documentation

## 2010-07-20 DIAGNOSIS — R079 Chest pain, unspecified: Secondary | ICD-10-CM | POA: Insufficient documentation

## 2010-07-20 LAB — POCT I-STAT 3, VENOUS BLOOD GAS (G3P V)
Acid-base deficit: 1 mmol/L (ref 0.0–2.0)
Acid-base deficit: 2 mmol/L (ref 0.0–2.0)
Acid-base deficit: 1 mmol/L (ref 0.0–2.0)
Bicarbonate: 23.9 mEq/L (ref 20.0–24.0)
Bicarbonate: 24.3 mEq/L — ABNORMAL HIGH (ref 20.0–24.0)
Bicarbonate: 24.7 mEq/L — ABNORMAL HIGH (ref 20.0–24.0)
Bicarbonate: 25.2 mEq/L — ABNORMAL HIGH (ref 20.0–24.0)
O2 Saturation: 58 %
O2 Saturation: 60 %
O2 Saturation: 61 %
O2 Saturation: 66 %
TCO2: 26 mmol/L (ref 0–100)
TCO2: 27 mmol/L (ref 0–100)
pCO2, Ven: 43.2 mmHg — ABNORMAL LOW (ref 45.0–50.0)
pCO2, Ven: 44.2 mmHg — ABNORMAL LOW (ref 45.0–50.0)
pCO2, Ven: 44.5 mmHg — ABNORMAL LOW (ref 45.0–50.0)
pCO2, Ven: 44.8 mmHg — ABNORMAL LOW (ref 45.0–50.0)
pH, Ven: 7.348 — ABNORMAL HIGH (ref 7.250–7.300)
pH, Ven: 7.348 — ABNORMAL HIGH (ref 7.250–7.300)
pH, Ven: 7.352 — ABNORMAL HIGH (ref 7.250–7.300)
pO2, Ven: 32 mmHg (ref 30.0–45.0)
pO2, Ven: 33 mmHg (ref 30.0–45.0)
pO2, Ven: 33 mmHg (ref 30.0–45.0)
pO2, Ven: 36 mmHg (ref 30.0–45.0)

## 2010-07-20 LAB — POCT I-STAT 3, ART BLOOD GAS (G3+)
Acid-base deficit: 2 mmol/L (ref 0.0–2.0)
pH, Arterial: 7.379 (ref 7.350–7.450)

## 2010-07-23 ENCOUNTER — Telehealth: Payer: Self-pay | Admitting: Internal Medicine

## 2010-07-23 ENCOUNTER — Inpatient Hospital Stay: Payer: 59 | Admitting: Family Medicine

## 2010-07-23 DIAGNOSIS — R0902 Hypoxemia: Secondary | ICD-10-CM

## 2010-07-23 NOTE — Telephone Encounter (Signed)
Dr Teressa Lower told me that heart cath (rt and left) was all normal. No orthodeoxia. His PFTs were completely normal. Please have him do methacholine challenge test for asthma and then come in 2 weeks or so for reevaluation. Till then he needs to continue to monitor and use his O2. Cause still unknown for low o2 but suspect viral pneumonitis that is clearing

## 2010-07-23 NOTE — Telephone Encounter (Signed)
Called and spoke with pt and he states he was to call after he had has his pft done and f/u w/ cardiology. Pt is wanting know when he needs to f/u. Pt also states his place of employment was going to be faxing over pt's previous pft test that has been done. Will also forward to [place] to see if she has seen these come through Dr. Marchelle Gearing please advise. Thanks

## 2010-07-23 NOTE — Telephone Encounter (Signed)
Pt is aware of heart cath and PFT results per MR. Order sent to [facility] to set up methacholine challenge and will follow-up with MR in the office on Fri., 08/10/2010.

## 2010-07-24 ENCOUNTER — Inpatient Hospital Stay: Payer: 59 | Admitting: Internal Medicine

## 2010-07-25 ENCOUNTER — Inpatient Hospital Stay: Payer: 59 | Admitting: Internal Medicine

## 2010-07-25 ENCOUNTER — Encounter: Payer: Self-pay | Admitting: Internal Medicine

## 2010-07-25 ENCOUNTER — Ambulatory Visit (HOSPITAL_COMMUNITY)
Admission: RE | Admit: 2010-07-25 | Discharge: 2010-07-25 | Disposition: A | Discharge status: Home / Self Care | Payer: 59 | Source: Ambulatory Visit | Attending: Internal Medicine | Admitting: Internal Medicine

## 2010-07-25 DIAGNOSIS — R0902 Hypoxemia: Secondary | ICD-10-CM | POA: Insufficient documentation

## 2010-07-26 ENCOUNTER — Encounter: Payer: Self-pay | Admitting: *Deleted

## 2010-07-26 ENCOUNTER — Telehealth: Payer: Self-pay | Admitting: Internal Medicine

## 2010-07-26 NOTE — Telephone Encounter (Signed)
Methacholine challenge is negative for asthma. He needs to come in week after next after 4/16 to discuss. Give break from work till next appt

## 2010-07-26 NOTE — Telephone Encounter (Signed)
Please give the extended note. Please give methacholine challenge test to me in [facility] today. I will update fu based on that result

## 2010-07-26 NOTE — Telephone Encounter (Signed)
Pt aware methacholine test did not show asthma. Pt is sch for f/u with MR on 08/10/2010 @ 4 pm. He requested that work note be faxed to [employer] at [phone]. Letter created and faxed.

## 2010-07-26 NOTE — Telephone Encounter (Signed)
Adam Chang has results and how long do you want to extend work note for. Pls advise.

## 2010-07-26 NOTE — Telephone Encounter (Signed)
I have called for Methacholine Challenge results to be faxed over forMR to review. Pt states PCP put him out of work through the 9th of April (Dr. Cleta Alberts). 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 Test results given to [facility]. Pls advise.

## 2010-07-27 ENCOUNTER — Encounter: Payer: Self-pay | Admitting: Internal Medicine

## 2010-07-30 NOTE — Discharge Summary (Signed)
Adam Chang, Adam Chang             ACCOUNT NO.:  [id number]  MEDICAL RECORD NO.:  [id number]           PATIENT TYPE:  I  LOCATION:  4702                         FACILITY:  [facility]  PHYSICIAN:  Leighton Roach Lennette Fader, M.D.DATE OF BIRTH:  04-25-1960  DATE OF ADMISSION:  07/05/2010 DATE OF DISCHARGE:  07/09/2010                              DISCHARGE SUMMARY   DISCHARGE DIAGNOSES: 1. 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 3. Hypertriglyceridemia. 4. Seasonal allergies.  DISCHARGE MEDICATIONS:  New medications: 1. Tylenol 650 mg p.o. q.6 h. P.r.n. 2. Azithromycin 250 mg p.o. daily for 2 additional days. 3. Omeprazole 40 mg 1 tablet p.o. daily.  He is continue the following medications: 1. Allegra over the counter 1 tablet p.o. daily. 2. Multivitamin 1 tablet p.o. daily. 3. TriCor 145 mg 1 tablet p.o. daily.  CONSULTS:  Pulmonology.  PROCEDURES/IMAGING:  He had a CT angiogram on July 05, 2010, impression, slight dependent haziness in both lungs, this might represent mild pulmonary edema.  The vascularity is slightly prominent. Otherwise, normal.  Chest x-ray on July 06, 2010, impression, mild basilar linear atelectasis or scarring, left greater than right.  No definite active process.  Chest x-ray on July 07, 2010, no active cardiopulmonary disease.  Echocardiogram done on July 08, 2010, showed LVEF of 55-60%, no regional wall abnormalities, mitral valve with mild regurgitation, PA peak pressure was 31 mmHg.  At the time of admission, CBC with WBC of 16.5, hemoglobin 13.5, hematocrit 39.4, platelets of 241.  Sodium was 138, potassium 4.0, chloride 110, bicarb of 20, BUN of 22, creatinine 1.4, and glucose of 94.  Urinalysis was negative.  BNP was 39.0.  During hospitalization, cardiac enzymes were negative x3. ESR was elevated at 32.  C-reactive protein was elevated at 7.4.  LDH was 160.  Procalcitonin was less than 0.10.  Rheumatoid  factor was less than 10.  ACE level was 16.  HIV antibody was nonreactive.  Strep pneumo urinary antigen was negative.  Blood cultures were negative x2.  Acid fast bacilli culture was negative.  Legionella antigen was negative.  At time of discharge, WBCs were 6.9, hemoglobin 13.4, hematocrit 39.9, platelets 212.  Sodium was 136, potassium 3.8, chloride 105, bicarb 25, BUN 23, creatinine 1.31, glucose of 97.  Multiple other labs and cultures were still pending at the time of discharge.  BRIEF HOSPITAL COURSE:  This is a 50 year old active male who presents with dyspnea on exertion. 1. Increased dyspnea on exertion and shortness of breath.  The patient     initially presented with worsening shortness of breath and dyspnea     on exertion over the past few days.  Also had deep pain with     inspiration and pleuritic-type chest pain.  The patient was     admitted, initially ruled out for MI with cardiac enzymes negative     x3.  Also ruled out for pulmonary embolus with negative CTA for     pulmonary embolus.  However, CTA did show diffuse dependent     haziness in both lungs.  The appearance of the lungs did have a  ground-glass type appearance.  The patient did have O2 requirement     initially on the hospital and did desaturate into the 70s with     ambulation.  ESR and CRP were elevated.  This was felt to be an     infectious versus autoimmune etiology.  Echocardiogram showed no     evidence of heart failure as cause of his dyspnea.  He was started     on the azithromycin 250 mg p.o. daily during admission to treat for     possible atypical organisms.  Pulmonology was consulted during the     admission and ordered multiple labs for workup of autoimmune causes     as well as infectious etiology.  A PPD was placed and was negative.     Pulmonology felt this was a viral pneumonitis.  The patient did     improve throughout the hospitalization spontaneously and was able     to maintain  the sats with ambulation at time of discharge.  At the     time of discharge, multiple other labs were still pending including     glomerular basement membrane antibody, histoplasma antibody, CMV     antibody, ANA, as well as Sjogren syndrome antibody and scleroderma     antibody.  Workup to date has been normal. 2. Hypertriglyceridemia.  The patient was continued on TriCor     throughout the hospitalization.  DISCHARGE INSTRUCTIONS:  The patient was instructed to increase activity slowly.  He has no restrictions in diet.  He was reminded if he had increased shortness of breath or chest pain, to return to the ER.  He is to follow up with Dr. Marchelle Gearing, the pulmonologist on July 18, 2010, at 1:30 p.m.  He is to follow up with Dr. Cleta Alberts or Dr. Merla Riches at [facility].  He is reminded to call within 1-2 weeks for followup.  FOLLOWUP ISSUES/RECOMMENDATIONS:  Recommend followup of pending labs that were ongoing from the hospitalization.  DISCHARGE CONDITION:  The patient is discharged home in stable medical condition.    ______________________________ [name], MD   ______________________________ Leighton Roach Dearis Danis, M.D.    CM/MEDQ  D:  07/12/2010  T:  07/12/2010  Job:  [id number]  cc:   Brett Canales A. Cleta Alberts, M.D.  Electronically Signed by [name] MD on 07/24/2010 09:25:11 PM Electronically Signed by Acquanetta Belling M.D. on 07/30/2010 11:19:16 AM

## 2010-08-02 ENCOUNTER — Encounter: Payer: Self-pay | Admitting: Internal Medicine

## 2010-08-02 ENCOUNTER — Telehealth: Payer: Self-pay | Admitting: Internal Medicine

## 2010-08-02 ENCOUNTER — Ambulatory Visit (INDEPENDENT_AMBULATORY_CARE_PROVIDER_SITE_OTHER): Payer: 59 | Admitting: Internal Medicine

## 2010-08-02 VITALS — BP 108/80 | HR 88 | Resp 18 | Ht 69.0 in | Wt 190.0 lb

## 2010-08-02 DIAGNOSIS — R0902 Hypoxemia: Secondary | ICD-10-CM

## 2010-08-02 NOTE — Assessment & Plan Note (Signed)
Seems to be slowly improving. My suspicion is that this is resolving pneumonitis. Will get limited echo with bubble study to further explore possibility of shunt. Is scheduled to see Dr. Marchelle Gearing next Friday. If symptoms persist will likely need bronch with biopsy. Discussed with Dr. Marchelle Gearing by phone.

## 2010-08-02 NOTE — Progress Notes (Signed)
Subjective:    Patient ID: Adam Chang, male    DOB: 04/17/1961, 50 y.o.   MRN: [id number]   HPI: Adam Chang is a 50 y/o male with a h/o hypertriglyceridemia and remote tobacco use (quit 25 years ago). No h/o known cardiopulmonary disease.   In January started with a vague cough. Then took a trip to [place] in February for a training class. While there had GI illness with n/v/diarrhea. (did not aspirate vomitus) Recovered from that. Then treated by PCP for bronchitis.   In March admitted to [facility] for dyspnea and significant hypoxemia. Chest CT negative for PE but with diffuse hazy opacities. BNP 69. Echo EF 55-60% normal RV mild MR. ANA, ANCA, HIV , [place]-70, RA, anti-GBM, Histo, TB, ACE all neg. ESR 32. No eosinophilia. Treated with antibiotics and discharged home. Saw Dr. Chase Caller and had repeat chest CT (which I reviewed) with complete clearance of interstitial edema / GGO. Had PFTs which were reportedly normal But did have mild exertional hypoxemia.    Had R and L cath on 07/20/10: Normal coronaries. Normal EF. Normal right heart pressures. No evidence of shunt.   Working diagnosis was resolving viral pneumonitis.  Returns for post-cath f/u. Feels he is getting better slowly. Still occasionaly dyspneic. Wears pulse-ox most of day and sats run 91-92% with occasional dip into high 80s. No real improvement. No swelling, CP, fevers or chills. Chronic cough. Was able to walk/jog 5 miles this am without problem.   ROS: All other systems normal except as listed in the HPI and Problem List.     Past Medical History  Diagnosis Date  . Hypertriglyceridemia   . Seasonal allergies   . Hypoxemia   . Alveolar/parietoalveolar pneumonopathy, other      Family History  Problem Relation Age of Onset  . Stroke Father 41     History   Social History  . Marital Status: Married    Spouse Name: N/A    Number of Children: N/A  . Years of Education: N/A   Occupational History  . electrician     Social History Main Topics  . Smoking status: Former Smoker -- 0.5 packs/day for 8 years    Types: Cigarettes    Quit date: 04/22/1985  . Smokeless tobacco: Not on file  . Alcohol Use: 1.2 oz/week    2 Shots of liquor per week  . Drug Use: No  . Sexually Active: Not on file   Other Topics Concern  . Not on file   Social History Narrative   Denies any TB exposure.  Denies having any mold  in the house.  Denies having birds.  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  He denies asbestos exposure.  Denies any  nitrofurantoin or amiodarone intake.      No Known Allergies   Outpatient Prescriptions Prior to Visit  Medication Sig Dispense Refill  . fexofenadine (ALLEGRA) 180 MG tablet Take 180 mg by mouth daily.        . Multiple Vitamins-Minerals (MULTIVITAMIN WITH MINERALS) tablet Take 1 tablet by mouth daily.        Marland Kitchen azithromycin (ZITHROMAX) 250 MG tablet Take 2 tablets by mouth on day 1, followed by 1 tablet by mouth daily for 4 days.      Marland Kitchen  omeprazole (PRILOSEC) 40 MG capsule Take 40 mg by mouth daily.        . TRICOR 145 MG tablet          Objective:   Physical Exam  Filed Vitals:   08/02/10 1428  BP: 108/80  Pulse: 88  Resp: 18   General:  Well appearing. No resp difficulty HEENT: normal Neck: supple. no JVD. Carotids 2+ bilat; no bruits. No lymphadenopathy or thryomegaly appreciated. Cor: PMI nondisplaced. Regular rate & rhythm. No rubs, gallops or murmurs. Lungs: clear Abdomen: soft, nontender, nondistended. No hepatosplenomegaly. No bruits or masses. Good bowel sounds. Extremities: no cyanosis, clubbing, rash, edema Neuro: alert & orientedx3, cranial nerves grossly intact. moves all 4 extremities w/o difficulty. Affect pleasant  ECG: NSR 70 No ST-T  wave abnormalities.    Assessment & Plan:

## 2010-08-02 NOTE — Patient Instructions (Signed)
Your physician has requested that you have an echocardiogram with bubble study. Echocardiography is a painless test that uses sound waves to create images of your heart. It provides your doctor with information about the size and shape of your heart and how well your heart's chambers and valves are working. This procedure takes approximately one hour. There are no restrictions for this procedure.

## 2010-08-06 ENCOUNTER — Ambulatory Visit (HOSPITAL_COMMUNITY): Payer: 59 | Attending: Emergency Medicine | Admitting: Radiology

## 2010-08-06 ENCOUNTER — Other Ambulatory Visit (HOSPITAL_COMMUNITY): Payer: Self-pay | Admitting: *Deleted

## 2010-08-06 DIAGNOSIS — R0602 Shortness of breath: Secondary | ICD-10-CM

## 2010-08-06 DIAGNOSIS — R0902 Hypoxemia: Secondary | ICD-10-CM | POA: Insufficient documentation

## 2010-08-06 NOTE — Progress Notes (Signed)
Saline lock started via R wrist with 20g angiocath for echo bubble study and 0.9% preservative free NaCl used by [employer].

## 2010-08-08 NOTE — Telephone Encounter (Signed)
Ok per Dr Gala Romney, note faxed

## 2010-08-09 ENCOUNTER — Encounter: Payer: Self-pay | Admitting: Internal Medicine

## 2010-08-09 ENCOUNTER — Encounter: Payer: Self-pay | Admitting: *Deleted

## 2010-08-10 ENCOUNTER — Encounter: Payer: Self-pay | Admitting: Internal Medicine

## 2010-08-10 ENCOUNTER — Ambulatory Visit (INDEPENDENT_AMBULATORY_CARE_PROVIDER_SITE_OTHER): Payer: 59 | Admitting: Internal Medicine

## 2010-08-10 ENCOUNTER — Encounter: Payer: Self-pay | Admitting: *Deleted

## 2010-08-10 DIAGNOSIS — J019 Acute sinusitis, unspecified: Secondary | ICD-10-CM | POA: Insufficient documentation

## 2010-08-10 DIAGNOSIS — R062 Wheezing: Secondary | ICD-10-CM

## 2010-08-10 DIAGNOSIS — R05 Cough: Secondary | ICD-10-CM

## 2010-08-10 DIAGNOSIS — R0902 Hypoxemia: Secondary | ICD-10-CM

## 2010-08-10 DIAGNOSIS — R059 Cough, unspecified: Secondary | ICD-10-CM

## 2010-08-10 MED ORDER — DOXYCYCLINE HYCLATE 100 MG PO CAPS: 100.0000 mg | ORAL_CAPSULE | Freq: Two times a day (BID) | ORAL | Status: DC

## 2010-08-10 NOTE — Assessment & Plan Note (Signed)
New on 08/10/2010. Lot of cough. Spirometry is normal.   Plan 10 days of doxyycline  Advised nett pot

## 2010-08-10 NOTE — Progress Notes (Signed)
  Subjective:    Patient ID: Adam Chang, male    DOB: 1960-04-24, 50 y.o.   MRN: [id number]  HPI Followup idiopathic hypoxemia. Here today to review test results. So far workup suggesting possible PFO. TEE pending per wife. However, complaining past week feeling feverish, runny nose, post nasal drip, cough, wheeze. Feels sick. No chills, sputum. Walking 5 miles and notes desaturation to 87% but keeps going. Denies other complaints. There is a new rash in right hip area which he was not aware of till my exam; does not have pain and denies poison ivy exposure. WE did spirometry today and is normal; fev1 4.48L/117% predicted (note recent methacholine challenge was negative)  Review of Systems Constitutional:   No  weight loss, night sweats,  Fevers, chills, fatigue, lassitude. HEENT:   No headaches,  Difficulty swallowing,  Tooth/dental problems,           CV:  No chest pain,  Orthopnea, PND, swelling in lower extremities, anasarca, dizziness, palpitations  GI  No heartburn, indigestion, abdominal pain, nausea, vomiting, diarrhea, change in bowel habits, loss of appetite  Resp: No shortness of breath with exertion or at rest.  No excess mucus, no productive cough,  No non-productive cough,  No coughing up of blood.  No change in color of mucu  No chest wall deformity  Skin: no rash or lesions.  GU: no dysuria, change in color of urine, no urgency or frequency.  No flank pain.  MS:  No joint pain or swelling.  No decreased range of motion.  No back pain.  Psych:  No change in mood or affect. No depression or anxiety.  No memory loss.    Objective:   Physical Exam  Nursing note and vitals reviewed. Constitutional: He is oriented to person, place, and time. He appears well-developed and well-nourished. No distress.  HENT:  Head: Normocephalic and atraumatic.  Right Ear: External ear normal.  Left Ear: External ear normal.  Mouth/Throat: Oropharynx is clear and moist. No oropharyngeal  exudate.  Eyes: Conjunctivae and EOM are normal. Pupils are equal, round, and reactive to light. Right eye exhibits no discharge. Left eye exhibits no discharge. No scleral icterus.  Neck: Normal range of motion. Neck supple. No JVD present. No tracheal deviation present. No thyromegaly present.  Cardiovascular: Normal rate, regular rhythm and intact distal pulses.  Exam reveals no gallop and no friction rub.   No murmur heard. Pulmonary/Chest: Effort normal and breath sounds normal. No respiratory distress. He has no wheezes. He has no rales. He exhibits no tenderness.       Noisy breathing +  Abdominal: Soft. Bowel sounds are normal. He exhibits no distension and no mass. There is no tenderness. There is no rebound and no guarding.  Musculoskeletal: Normal range of motion. He exhibits no edema and no tenderness.  Lymphadenopathy:    He has no cervical adenopathy.  Neurological: He is alert and oriented to person, place, and time. He has normal reflexes. No cranial nerve deficit. Coordination normal.  Skin: Skin is warm and dry. No rash noted. He is not diaphoretic. No erythema. No pallor.  Psychiatric: He has a normal mood and affect. His behavior is normal. Judgment and thought content normal.          Assessment & Plan:

## 2010-08-10 NOTE — Assessment & Plan Note (Signed)
On OV 08/10/2010 did not desaturate walking 185 feet x 3 laps. This is an improvement but  He states when he walks 5 miles pulse ox drops to 87%. So far workup suggesting R->L shunt. TEE pending. Will see what TEE shows. Have reassured them that if workup is all negative, then we will seek 2nd opinion

## 2010-08-10 NOTE — Patient Instructions (Signed)
You seem to have acute sinusitis Take doxycycline as prescribed Finish up workup with Dr. Teressa Lower Return to see me in 6 weeks or sooner if needed You can be excused from work for next 10 days I will be in touch with Dr Teressa Lower

## 2010-08-14 ENCOUNTER — Ambulatory Visit (HOSPITAL_COMMUNITY)
Admission: RE | Admit: 2010-08-14 | Discharge: 2010-08-14 | Disposition: A | Discharge status: Home / Self Care | Payer: 59 | Source: Ambulatory Visit | Attending: Internal Medicine | Admitting: Internal Medicine

## 2010-08-14 ENCOUNTER — Telehealth: Payer: Self-pay | Admitting: Internal Medicine

## 2010-08-14 DIAGNOSIS — I059 Rheumatic mitral valve disease, unspecified: Secondary | ICD-10-CM | POA: Insufficient documentation

## 2010-08-14 DIAGNOSIS — R0989 Other specified symptoms and signs involving the circulatory and respiratory systems: Secondary | ICD-10-CM | POA: Insufficient documentation

## 2010-08-14 DIAGNOSIS — Q2111 Secundum atrial septal defect: Secondary | ICD-10-CM | POA: Insufficient documentation

## 2010-08-14 DIAGNOSIS — Q211 Atrial septal defect: Secondary | ICD-10-CM | POA: Insufficient documentation

## 2010-08-14 DIAGNOSIS — R0609 Other forms of dyspnea: Secondary | ICD-10-CM | POA: Insufficient documentation

## 2010-08-14 NOTE — Telephone Encounter (Signed)
Please tell him that dr Delene Ruffini and I spoke. Today' TEE only shows a small hole causing outflow of oxygen from R-> L. WE are unsure if this fully explains the problems or not. Therefore, he should next do CPST with EIB testing with Mr Laymond Purser. Pre- and post- ABG as well. Please write reason for test: DYSPNEA. KNOWN SMALL PFO.

## 2010-08-15 ENCOUNTER — Telehealth: Payer: Self-pay | Admitting: Internal Medicine

## 2010-08-15 DIAGNOSIS — R0600 Dyspnea, unspecified: Secondary | ICD-10-CM

## 2010-08-15 NOTE — Telephone Encounter (Signed)
Spoke with pt and notified per phone note from MR- pt needs CPST with EIB testing and pre and post ABG as well.  Pt verbalized understanding and is aware that order will be sent to [facility] and they should be contacting him soon.

## 2010-08-16 ENCOUNTER — Telehealth: Payer: Self-pay | Admitting: Internal Medicine

## 2010-08-16 NOTE — Telephone Encounter (Signed)
Pt Dropped off 2 ROI for Records to be Sent  1.[employer] P)[phone] 2.[facility] [phone] Sent to [facility] for processing. 08/16/10/km

## 2010-08-17 ENCOUNTER — Telehealth: Payer: Self-pay | Admitting: Internal Medicine

## 2010-08-17 DIAGNOSIS — R0600 Dyspnea, unspecified: Secondary | ICD-10-CM

## 2010-08-17 NOTE — Telephone Encounter (Signed)
Request for Records received from [facility] sent to [facility] for processing 08/17/10/KM

## 2010-08-17 NOTE — Telephone Encounter (Signed)
Adam Chang hs pt scheduled for CPST 09/03/10. Pt states his work note is only good until 08/20/10. Pt wants to know if he is to return to work after 08/20/10 or if he needs another work note to be out. Please advise Dr. Marchelle Gearing. Thanks  Adam Chang, CMA

## 2010-08-17 NOTE — Telephone Encounter (Signed)
He can go back to work but if he wants to wait till CPST is complete you can give him off work for date of cpst + extra 7-10 days

## 2010-08-17 NOTE — Telephone Encounter (Signed)
Pt states he would like to go back to work on April 30th but will call if he has any problems. He would also like to know if he should continue to hold off on eye surgery, as well as, can he have a colonoscopy if this is mentioned at his physical next week. MR pls advise.  Pt also wants to know if there is any way to get a sooner appt for the CPST. I will forward this to the [facility] and have them check on this for the patient. Libby or Rhonda pls advise.

## 2010-08-17 NOTE — Telephone Encounter (Signed)
Looks like the order did not go through. Placed new order in epic for pt to have cpst. Please advise PCC. Thanks  Carver Fila, CMA

## 2010-08-20 LAB — AFB CULTURE WITH SMEAR (NOT AT ARMC): Acid Fast Smear: NONE SEEN

## 2010-08-20 NOTE — Telephone Encounter (Signed)
Ok to go back to work 4/30 Hold off on colonoscopy and eye surgery till CPST done [facility] to check if CPST can be done earlier

## 2010-08-20 NOTE — Telephone Encounter (Signed)
Called, spoke with pt.  He is aware ok to go back to work on 4/30 but needs to hold off on colonoscopy and eye surgery til CPST done.  He is aware we will see if this can be done earlier and will call back with this info.  He verbalized understanding of these instructions.  PCC's, pls check to see if the CPST can be done sooner.  He is already scheduled for 09/03/10.  Thanks!

## 2010-08-21 ENCOUNTER — Telehealth: Payer: Self-pay | Admitting: Internal Medicine

## 2010-08-21 NOTE — Cardiovascular Report (Signed)
Adam Chang, Adam Chang             ACCOUNT NO.:  [id number]  MEDICAL RECORD NO.:  [id number]           PATIENT TYPE:  LOCATION:                                 FACILITY:  PHYSICIAN:  Bevelyn Buckles. Merl Guardino, MDDATE OF BIRTH:  Aug 31, 1960  DATE OF PROCEDURE:  07/20/2010 DATE OF DISCHARGE:                           CARDIAC CATHETERIZATION   PATIENT INDICATIONS:  Mr. Macomber is a very pleasant 50 year old man who developed diffuse lung infiltrates after a trip to [place].  This was associated with dyspnea and severe hypoxemia.  He subsequently had clearing of his infiltrates, however, it persisted with some dyspnea and mild hypoxemia.  He is also having exertional chest pressure.  I saw him in the office yesterday and he is brought in today for right and left heart catheterization with oximetry run.  PROCEDURES PERFORMED: 1. Right heart catheterization. 2. Oximetry run. 3. Selective coronary angiography. 4. Left heart catheterization. 5. Left ventriculogram.  DESCRIPTION OF PROCEDURE:  The risks and indications were explained. Consent was signed and placed on the chart.  A 4-French arterial sheath was placed in the right femoral artery using a modified Seldinger technique.  Standard catheters including a JL-4, 3-DRC, and angled pigtail were used.  All catheter exchanges made over a wire.  A 7-French venous sheath was also placed in the right femoral vein.  Standard Swan- Ganz catheter was used for the right heart cath.  We used an 0.25 wire to perform an oximetry run.  There were no apparent complications.  Of note, prior to beginning the procedure, we actually had the patient lie down and sit up on the cath table.  Saturations remained between 93- 96% on pulse oximetry.  There was no evidence of platypnea orthodeoxia. Of note, we spent several minutes trying to get hepatic wedge pressure. We were unable to find a cannulate the hepatic vein during the procedure.  HEMODYNAMIC  RESULTS:  Right atrial pressure mean of 3.  RV pressure 26/0 with EDP of 6.  PA pressure 24/8 with a mean of 14.  Pulmonary capillary wedge was a mean of 5.  Central aortic pressure 103/71 with a mean of 86.  LV pressure 86/15 with an EDP of 10.  There was no aortic stenosis on pullback.  On simultaneous LV wedge tracing, there was no significant mitral regurgitation or mitral stenosis.  Pulmonary vascular resistance was 1.6 Woods units.  Saturations on oximetry, femoral artery saturation was 89% on room air after receiving some mild sedation.  Right atrial saturation was initially 66%.  I will recheck this and it was 61%.  SVC saturation was 58%.  RV saturation was 60%.  PA saturation was 58% and 61%.  Left main was normal.  LAD was a moderate-sized vessel which gave off a large diagonal and was angiographically normal.  Left circumflex gave off a large marginal branch and 2 posterolaterals and was angiographically normal.  Right coronary artery is a large, dominant vessel with a very large PDA and posterolateral and was angiographically normal.  Left ventricle:  Left ventriculogram done in the RAO position showed an EF of 65% with no regional wall motion abnormalities.  On panning down over the abdominal aorta after the ventriculogram, there was no evidence of abdominal aortic aneurysm.  ASSESSMENT: 1. Normal coronary arteries. 2. Normal left ventricular function. 3. Normal right-sided and pulmonary pressures. 4. Minimal step-up on one of the right atrial saturations with the     other one being normal, can exclude a small patent foramen ovale,     but otherwise no evidence of significant intracardiac shunt. 5. Mild resting hypoxemia without evidence of platypnea orthodeoxia.  PLAN:  As discussion, he has an essentially normal heart catheterization except for a mild hypoxemia on his arterial blood gas.  I suspect this is a resolving pneumonitis.  I have discussed this with Dr.  Marchelle Gearing. We will continue to follow him conservatively.  If his symptoms of hypoxemia continue, consider possible bronchoscopy with biopsy.     Bevelyn Buckles. Kari Kerth, MD     DRB/MEDQ  D:  07/20/2010  T:  07/21/2010  Job:  [id number]  cc:   Kalman Shan, MD Leighton Roach McDiarmid, M.D.  Electronically Signed by Arvilla Meres MD on 08/21/2010 07:10:20 PM

## 2010-08-21 NOTE — Telephone Encounter (Signed)
See phone note dated 08/15/10

## 2010-08-21 NOTE — Telephone Encounter (Signed)
Per [name] okay to give Dr. Cleta Alberts MR cell number. Spoke w/ Romona and gave her MR # and she states she will give this to Dr. Cleta Alberts. Nothing further was needed

## 2010-08-22 ENCOUNTER — Other Ambulatory Visit: Payer: Self-pay | Admitting: Internal Medicine

## 2010-08-22 ENCOUNTER — Ambulatory Visit (HOSPITAL_COMMUNITY): Payer: 59 | Attending: Internal Medicine

## 2010-08-22 ENCOUNTER — Telehealth: Payer: Self-pay | Admitting: *Deleted

## 2010-08-22 DIAGNOSIS — R0602 Shortness of breath: Secondary | ICD-10-CM | POA: Insufficient documentation

## 2010-08-22 LAB — BLOOD GAS, ARTERIAL
Acid-base deficit: 3.7 mmol/L — ABNORMAL HIGH (ref 0.0–2.0)
Acid-base deficit: 5.5 mmol/L — ABNORMAL HIGH (ref 0.0–2.0)
Bicarbonate: 20.5 mEq/L (ref 20.0–24.0)
Drawn by: 122601
FIO2: 0.21 %
O2 Saturation: 91.4 %
O2 Saturation: 91.6 %
Patient temperature: 98.6
Patient temperature: 98.6
TCO2: 20 mmol/L (ref 0–100)
TCO2: 21.6 mmol/L (ref 0–100)
pH, Arterial: 7.36 (ref 7.350–7.450)
pO2, Arterial: 60.7 mmHg — ABNORMAL LOW (ref 80.0–100.0)
pO2, Arterial: 64.1 mmHg — ABNORMAL LOW (ref 80.0–100.0)

## 2010-08-22 NOTE — Telephone Encounter (Signed)
Dr. Marchelle Gearing called regarding CPST scheduled for this patient and needs ABG's added. ABG to be done pre-test and at peak exercise. Unable to reach [place] and will call him in the morning.  Left msg for Renae Fickle to call me back regarding ABG's. Renae Fickle returned call and spoke to [place] and was informed that the CPST had been moved from 09/03/2010 @ [facility] to 08/22/2010 @ 1:30 pm. Pt had not been informed of this change and [name] called the patient to let him know and patient was okay with this change.   I called respiratory and spoke with Darlene and she was already aware ABG's are to be done and says she can work off of the CPST order. She is aware to do ABG's pre-test and at peak exercise. Nothing further is needed from our office regarding this matter.

## 2010-08-22 NOTE — Telephone Encounter (Signed)
[email or web address]  09/03/1219:30pm

## 2010-08-24 ENCOUNTER — Ambulatory Visit: Payer: Self-pay | Admitting: Family Medicine

## 2010-08-24 ENCOUNTER — Telehealth: Payer: Self-pay | Admitting: Internal Medicine

## 2010-08-27 ENCOUNTER — Encounter: Payer: Self-pay | Admitting: Internal Medicine

## 2010-08-27 ENCOUNTER — Telehealth: Payer: Self-pay | Admitting: Internal Medicine

## 2010-08-27 ENCOUNTER — Ambulatory Visit (INDEPENDENT_AMBULATORY_CARE_PROVIDER_SITE_OTHER): Payer: 59 | Admitting: Internal Medicine

## 2010-08-27 VITALS — BP 104/76 | HR 83 | Temp 98.6°F | Ht 69.0 in | Wt 191.8 lb

## 2010-08-27 DIAGNOSIS — R0902 Hypoxemia: Secondary | ICD-10-CM

## 2010-08-27 NOTE — Assessment & Plan Note (Signed)
Hypoxemia is worse. I thought it was shunt physiology but his crackles have recurred  PLAN Restart o2 Continue short term disability HRCT chest If pulmonary infilrates, plan bronch with bal and bx Await skin bx (I called Dr Cleta Alberts cell and lmtcb)

## 2010-08-27 NOTE — Telephone Encounter (Signed)
Spoke to him. Will see him in office.

## 2010-08-27 NOTE — Telephone Encounter (Signed)
Patients wife phoned she wanted an appointment this week stated that her husband was worse, Dr Marchelle Gearing had opening at 3:30 scheduled pt. Adam Chang

## 2010-08-27 NOTE — Patient Instructions (Signed)
You need your oxygen back - wear 2L HAve HRCT scan chest without contrast tomorrow  I will get in touch with Dr Cleta Alberts about skin biopsy - Provisionally arrange for bronch on Wednesday 08/29/2010 after 10am

## 2010-08-27 NOTE — Progress Notes (Signed)
Subjective:    Patient ID: Adam Chang, male    DOB: 02-Oct-1960, 50 y.o.   MRN: [id number]  HPI  OV 08/10/2010: Followup idiopathic hypoxemia. Here today to review test results. So far workup suggesting possible PFO. TEE pending per wife. However, complaining past week feeling feverish, runny nose, post nasal drip, cough, wheeze. Feels sick. No chills, sputum. Walking 5 miles and notes desaturation to 87% but keeps going. Denies other complaints. There is a new rash in right hip area which he was not aware of till my exam; does not have pain and denies poison ivy exposure. WE did spirometry today and is normal; fev1 4.48L/117% predicted (note recent methacholine challenge was negative). RX: ACUTE SINUSITIS, RASH - see DR DAUB, Get TEE and then CPST  OV 08/27/2010: In interim, had TEE 08/14/2010 - showed small r->L shunt with early bubbles c/w PFO but Dr. Teressa Lower does not think this explains hypoxemia. Had skin biopsy of rt infr-axillary rash several days ago and is awating biopsy result. In interim, symptoms returned/worse. Had simple exertional pulse ox and abg on Aug 22, 2010 (cpst machine metabolic cart not working). He desaturated on treadmill run and did not correct with o2 or only mildly corrected (see ABGs) but this was not as pronounced on sitting position in bike but recurred again on bike. The type of dyspnea he complains is c/w with this observation. Wife says when he bends over not that dyspneic but otherwise very dyspneic. STill going for daily walks but even simple exertion can make him very dyspneic. Very concerned. Sleeping all the time, Tired all the time. Says he is not anxious. Also, tripped easily in bathroom.  Dr. Cleta Alberts called me several days ago and thought exam had crackles +     Review of Systems Constitutional:  Feels tired + HEENT:   No headaches,  Difficulty swallowing,  Tooth/dental problems,  Sore throat,                No sneezing, itching, ear ache, nasal congestion,  post nasal drip,   CV:  No chest pain,  Orthopnea, PND, swelling in lower extremities, anasarca, palpitations  GI  No heartburn, indigestion, abdominal pain, nausea, vomiting, diarrhea, change in bowel habits, loss of appetite  Resp: .  No excess mucus, no productive cough,  No non-productive cough,  No coughing up of blood.  No change in color of mucus.  No wheezing.  No chest wall deformity  Skin: no rash or lesions.  GU: no dysuria, change in color of urine, no urgency or frequency.  No flank pain.  MS:  No joint pain or swelling.  No decreased range of motion.  No back pain.  Psych:  No change in mood or affect. No depression or anxiety.  No memory loss.      Objective:   Physical Exam  [nursing notereviewed. Constitutional: He is oriented to person, place, and time. He appears well-developed and well-nourished. No distress.       Obese, sitting in wheel chair, talkative today, nasal cannula on  HENT:  Head: Normocephalic and atraumatic.  Right Ear: External ear normal.  Left Ear: External ear normal.  Mouth/Throat: Oropharynx is clear and moist. No oropharyngeal exudate.  Eyes: Conjunctivae and EOM are normal. Pupils are equal, round, and reactive to light. Right eye exhibits no discharge. Left eye exhibits no discharge. No scleral icterus.  Neck: Normal range of motion. Neck supple. No JVD present. No tracheal deviation present. No thyromegaly present.  Cardiovascular: Normal rate, regular rhythm and intact distal pulses.  Exam reveals no gallop and no friction rub.   No murmur heard. Pulmonary/Chest: Effort normal. No respiratory distress. He has no wheezes. He has rales. He exhibits no tenderness.       Rales have recurred +  Abdominal: Soft. Bowel sounds are normal. He exhibits no distension and no mass. There is no tenderness. There is no rebound and no guarding.  Musculoskeletal: Normal range of motion. He exhibits no edema and no tenderness.       ++ edema    Lymphadenopathy:    He has no cervical adenopathy.  Neurological: He is alert and oriented to person, place, and time. He has normal reflexes. No cranial nerve deficit. Coordination normal.  Skin: Skin is warm and dry. Rash noted. He is not diaphoretic. No erythema. No pallor.       Rash + on right infra-axillary area +. Unchanged from last visit. Some bruise below left knee related to recent fall  Psychiatric: He has a normal mood and affect. His behavior is normal. Judgment and thought content normal.          Assessment & Plan:

## 2010-08-27 NOTE — Telephone Encounter (Signed)
Pt says that his breathing is worse and he is short of breath with exertion and at rest sometimes. His O2 sats have not been above 88% this weekend and he wants to know what the CPST showed. Pt offered appt this afternoon with MR but he did not feel an appt was necessary at this time and would like for this msg to be forwarded to MR. Pls advise.

## 2010-08-28 ENCOUNTER — Other Ambulatory Visit: Payer: Self-pay | Admitting: Dermatology

## 2010-08-28 ENCOUNTER — Ambulatory Visit (INDEPENDENT_AMBULATORY_CARE_PROVIDER_SITE_OTHER)
Admission: RE | Admit: 2010-08-28 | Discharge: 2010-08-28 | Disposition: A | Discharge status: Home / Self Care | Payer: 59 | Source: Ambulatory Visit | Attending: Internal Medicine | Admitting: Internal Medicine

## 2010-08-28 DIAGNOSIS — R0902 Hypoxemia: Secondary | ICD-10-CM

## 2010-08-29 ENCOUNTER — Encounter (HOSPITAL_COMMUNITY): Payer: 59

## 2010-08-30 ENCOUNTER — Telehealth: Payer: Self-pay | Admitting: Internal Medicine

## 2010-08-30 ENCOUNTER — Encounter: Payer: Self-pay | Admitting: Internal Medicine

## 2010-08-30 ENCOUNTER — Other Ambulatory Visit (INDEPENDENT_AMBULATORY_CARE_PROVIDER_SITE_OTHER): Payer: 59

## 2010-08-30 DIAGNOSIS — R0902 Hypoxemia: Secondary | ICD-10-CM

## 2010-08-30 DIAGNOSIS — J849 Interstitial pulmonary disease, unspecified: Secondary | ICD-10-CM

## 2010-08-30 DIAGNOSIS — J84115 Respiratory bronchiolitis interstitial lung disease: Secondary | ICD-10-CM

## 2010-08-30 LAB — SEDIMENTATION RATE: Sed Rate: 15 mm/hr (ref 0–22)

## 2010-08-30 LAB — APTT: aPTT: 36.8 s — ABNORMAL HIGH (ref 21.7–28.8)

## 2010-08-30 NOTE — Telephone Encounter (Signed)
Spoke w/ pt and he is coming in today to have labs done. Pt is aware where to go for these labs. Pt states he will come w/in the next hr for labs.

## 2010-08-30 NOTE — Telephone Encounter (Addendum)
I am doing bronch tomorrow at 9am. Please have him do PT, PTT, Fungal hypersensitivity pneumonitis panel, serum histoplasma antibody titer, crp and esr, cbc with differential. IF he can do it today would be great. Please call him. Orders placed. Being sent to [facility] (not jennifer) per [employer]

## 2010-08-30 NOTE — Telephone Encounter (Signed)
Spoke with pt and he is aware that we received the sample sputum he dropped off. I advised that he should keep appt for bronch and MR will be able to take a closer look into his lungs. He verbalized understanding.

## 2010-08-31 ENCOUNTER — Encounter: Payer: Self-pay | Admitting: Internal Medicine

## 2010-08-31 ENCOUNTER — Ambulatory Visit (HOSPITAL_COMMUNITY)
Admission: RE | Admit: 2010-08-31 | Discharge: 2010-08-31 | Disposition: A | Discharge status: Home / Self Care | Payer: 59 | Source: Ambulatory Visit | Attending: Internal Medicine | Admitting: Internal Medicine

## 2010-08-31 ENCOUNTER — Other Ambulatory Visit: Payer: Self-pay | Admitting: Internal Medicine

## 2010-08-31 DIAGNOSIS — R7981 Abnormal blood-gas level: Secondary | ICD-10-CM

## 2010-08-31 DIAGNOSIS — R0902 Hypoxemia: Secondary | ICD-10-CM | POA: Insufficient documentation

## 2010-08-31 DIAGNOSIS — J984 Other disorders of lung: Secondary | ICD-10-CM

## 2010-08-31 LAB — BODY FLUID CELL COUNT WITH DIFFERENTIAL
Eos, Fluid: 2 %
Lymphs, Fluid: 24 %
Monocyte-Macrophage-Serous Fluid: 12 % — ABNORMAL LOW (ref 50–90)
Neutrophil Count, Fluid: 64 % — ABNORMAL HIGH (ref 0–25)
Total Nucleated Cell Count, Fluid: 96 cu mm (ref 0–1000)

## 2010-09-03 ENCOUNTER — Inpatient Hospital Stay (HOSPITAL_COMMUNITY): Payer: 59

## 2010-09-03 ENCOUNTER — Emergency Department (HOSPITAL_COMMUNITY): Payer: 59

## 2010-09-03 ENCOUNTER — Other Ambulatory Visit: Payer: Self-pay | Admitting: Internal Medicine

## 2010-09-03 ENCOUNTER — Ambulatory Visit (HOSPITAL_COMMUNITY): Payer: 59

## 2010-09-03 ENCOUNTER — Inpatient Hospital Stay (HOSPITAL_COMMUNITY)
Admission: EM | Admit: 2010-09-03 | Discharge: 2010-09-05 | DRG: 201 | Disposition: A | Discharge status: Home / Self Care | Payer: 59 | Attending: Critical Care Medicine | Admitting: Critical Care Medicine

## 2010-09-03 DIAGNOSIS — R0902 Hypoxemia: Secondary | ICD-10-CM

## 2010-09-03 DIAGNOSIS — J9383 Other pneumothorax: Principal | ICD-10-CM | POA: Diagnosis present

## 2010-09-03 DIAGNOSIS — R0602 Shortness of breath: Secondary | ICD-10-CM

## 2010-09-03 DIAGNOSIS — J8410 Pulmonary fibrosis, unspecified: Secondary | ICD-10-CM

## 2010-09-03 DIAGNOSIS — J95811 Postprocedural pneumothorax: Secondary | ICD-10-CM

## 2010-09-03 LAB — BASIC METABOLIC PANEL
BUN: 20 mg/dL (ref 6–23)
Calcium: 9.9 mg/dL (ref 8.4–10.5)
GFR calc non Af Amer: >60 mL/min (ref >60)
Potassium: 3.8 mEq/L (ref 3.5–5.1)

## 2010-09-03 LAB — DIFFERENTIAL
Basophils Relative: 0 % (ref 0–1)
Eosinophils Absolute: 0.1 10*3/uL (ref 0.0–0.7)
Eosinophils Relative: 1 % (ref 0–5)
Monocytes Relative: 5 % (ref 3–12)
Neutrophils Relative %: 91 % — ABNORMAL HIGH (ref 43–77)

## 2010-09-03 LAB — CULTURE, RESPIRATORY W GRAM STAIN

## 2010-09-03 LAB — CBC
MCH: 28.2 pg (ref 26.0–34.0)
Platelets: 171 10*3/uL (ref 150–400)
RBC: 5.35 MIL/uL (ref 4.22–5.81)
RDW: 13.4 % (ref 11.5–15.5)
WBC: 18.1 10*3/uL — ABNORMAL HIGH (ref 4.0–10.5)

## 2010-09-03 LAB — PATHOLOGIST SMEAR REVIEW

## 2010-09-03 LAB — D-DIMER, QUANTITATIVE: D-Dimer, Quant: 0.28 ug/mL-FEU (ref 0.00–0.48)

## 2010-09-04 ENCOUNTER — Inpatient Hospital Stay (HOSPITAL_COMMUNITY): Payer: 59

## 2010-09-04 DIAGNOSIS — J984 Other disorders of lung: Secondary | ICD-10-CM

## 2010-09-04 LAB — BASIC METABOLIC PANEL
BUN: 17 mg/dL (ref 6–23)
Calcium: 9.2 mg/dL (ref 8.4–10.5)
Creatinine, Ser: 0.92 mg/dL (ref 0.4–1.5)
GFR calc non Af Amer: >60 mL/min (ref >60)
Glucose, Bld: 107 mg/dL — ABNORMAL HIGH (ref 70–99)
Potassium: 3.6 mEq/L (ref 3.5–5.1)

## 2010-09-04 LAB — CBC
HCT: 40.5 % (ref 39.0–52.0)
MCH: 28.4 pg (ref 26.0–34.0)
MCHC: 34.3 g/dL (ref 30.0–36.0)
MCV: 82.7 fL (ref 78.0–100.0)
Platelets: 183 10*3/uL (ref 150–400)
RDW: 13.8 % (ref 11.5–15.5)
WBC: 10.4 10*3/uL (ref 4.0–10.5)

## 2010-09-04 NOTE — H&P (Signed)
NAMECLINTON, DRAGONE             ACCOUNT NO.:  [id number]  MEDICAL RECORD NO.:  [id number]           PATIENT TYPE:  I  LOCATION:  2012                         FACILITY:  [facility]  PHYSICIAN:  Coralyn Helling, MD        DATE OF BIRTH:  February 18, 1961  DATE OF ADMISSION:  09/03/2010 DATE OF DISCHARGE:                             HISTORY & PHYSICAL   ADMISSION DIAGNOSIS:  Pneumothorax.  CHIEF COMPLAINT:  Dyspnea.  Mr. Hornback is a 50 year old male who is followed by Dr. Marchelle Gearing as an outpatient for hypoxemia.  He is on an extensive outpatient workup.  He was found to have a positive ANA with negative titers, and the remainder of his vasculitis workup was negative.  He was noted to have diffuse ground-glass opacification on CT scan of chest from July 05, 2010.  He had undergone a bronchoscopy on Aug 31, 2010, to further assess this. His postprocedure chest x-ray did not show any evidence for pneumothorax.  However, over the following 2 days, Mr. Adam Chang noticed more left-sided chest discomfort as well as dyspnea.  As a result, he was brought to the emergency room on May 14 and chest x-ray was done, which showed a 50% left pneumothorax, and as result we felt that he needed hospital admission related to his pneumothorax in addition to hypoxemia.  In the emergency room, he had a 20-French chest tube inserted with resolution of pneumothorax.  PAST MEDICAL HISTORY: 1. Significant for hypoxemia in the setting of diffuse pulmonary     infiltrates. 2. Hypertriglyceridemia.  PAST SURGICAL HISTORY:  Significant for bronchoscopy on Aug 31, 2010, cardiac catheterization July 24, 2010, with normal coronary arteries, normal right ventricular pressure, and normal left ventricular function with minimal step-up at one of the right atrial saturations with the other one being normal.  SOCIAL HISTORY:  He is married.  He lives with his wife.  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   There is no history of smoking or tobacco abuse.  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  FAMILY HISTORY:  Significant for his father who had a stroke.  REVIEW OF SYSTEMS:  Unremarkable except for stated above.  PHYSICAL EXAMINATION:  GENERAL:  He is seen the emergency room.  He is awake and alert, does appear to have some degree of discomfort with deep respirations.  Otherwise, he is alert and oriented. HEENT:  Pupils equal and reactive.  Extraocular muscles intact.  There is no scleral icterus.  There is no sinus tenderness.  No nasal discharge or oral lesions. NECK:  No lymphadenopathy, no thyromegaly. HEART:  S1 and S2.  Regular rhythm.  No murmur. CHEST:  He has diminished breath sounds on the left.  There is no wheezing or rales. ABDOMEN:  Soft, nontender.  Positive bowel sounds.  No organomegaly. EXTREMITIES:  No edema, cyanosis, or clubbing. NEUROLOGIC:  He had normal strength.  Cranial nerves were intact.  WBC 18.1, hemoglobin is 15.1, hematocrit is 44.1, platelet count is 171. D-dimer is 0.28.  Sodium is 138, potassium is 3.8, chloride is 102, CO2 is 24,  BUN is 20, creatinine is 1.04, glucose is 110, calcium is 9.8. Chest x-ray showed 50% pneumothorax on the left.  Otherwise, no acute findings.  Followup chest x-ray showed chest tube an appropriate issue with near resolution of the left pneumothorax with mild soft tissue emphysema, and basilar atelectasis.  Impression is that of a 51 year old male status post bronchoscopy on Aug 21, 2010, now with 50% pneumothorax on the left.  He has improvement in appearance of his pneumothorax after chest tube insertion.  He will be admitted to the hospital with followup of his chest x-ray, and we have placed a chest tube to negative 10 cm water pressure.  His bronchoscopy results are still pending at this time.  I will keep him on as-needed pain medications to alleviate the discomfort from his chest tube insertion.   We will also monitor his oxygenation closely while in the hospital.     Coralyn Helling, MD     VS/MEDQ  D:  09/03/2010  T:  09/03/2010  Job:  [id number]  Electronically Signed by Coralyn Helling MD on 09/04/2010 11:49:53 AM

## 2010-09-05 ENCOUNTER — Inpatient Hospital Stay (HOSPITAL_COMMUNITY): Payer: 59

## 2010-09-05 ENCOUNTER — Telehealth: Payer: Self-pay | Admitting: Internal Medicine

## 2010-09-05 LAB — LEGIONELLA PROFILE(CULTURE+DFA/SMEAR): Legionella Antigen (DFA): NEGATIVE

## 2010-09-05 LAB — HISTOPLASMA ANTIBODIES: Histoplasma Ab Yeast CF: <1:8 {titer}

## 2010-09-05 NOTE — Op Note (Signed)
NAMEZIV, Adam Chang             ACCOUNT NO.:  [id number]  MEDICAL RECORD NO.:  [id number]           PATIENT TYPE:  O  LOCATION:  XRAY                         FACILITY:  [facility]  PHYSICIAN:  Kalman Shan, MD   DATE OF BIRTH:  16-Apr-1961  DATE OF PROCEDURE:  08/31/2010 DATE OF DISCHARGE:                              OPERATIVE REPORT   TYPE OF PROCEDURE:  Bronchoscopy with bronchoalveolar lavage and transbronchial biopsy.  INDICATIONS: 1. Hypoxemia. 2. Suspect interstitial lung disease. 3. Hypersensitivity pneumonitis possibility. 4. Diffuse parenchymal lung disease.  PREPROCEDURE ASSESSMENT HISTORY AND PHYSICAL:  Reviewed less than 13 days old, no changes noted.  Physical assessment height 5 feet 9 inches, weight 190 pounds, blood pressure 123/86, pulse of 67, respiratory rate of 17.  Airway Mallampati class I.  ASA class is 1.  Physical exam was within normal limits.  Recent laboratory reviewed including coagulation all within normal.  The patient was considered acceptable for moderate sedation and could undergo procedure.  SEDATION PLAN:  Fentanyl, Versed moderate sedation with topical lidocaine anesthesia.  Of note, the patient particularly requested that he not be sedated for the bronchoscopy because he wanted to watch it live, therefore, we agreed that we would give him lidocaine and allow him to watch the bronchoscopy up to the point of bronchoalveolar lavage but will be given increased sedation at the time of transbronchial biopsy.  He agreed to this plan.  We did warn him about the potential for cough, anxiety and vasovagal issues.  He also agreed to have some mild dose of sedation before the start of the procedure just to "take the edge of."  TOTAL SEDATION USED:  Fentanyl 100 mcg, Versed 4 mg.  Most of this was give after lavage and left before the biopsy.  Lidocaine 30 mL was used.  PROCEDURE NOTE:  After adequate topical lidocaine anesthesia, the  small bronchoscope was passed through the right naris.  There was some difficulty negotiating through this, but after application of mild pressure, it easily negotiated without any epistaxis.  The epiglottis was visualized and looked normal.  The vocal cords looked normal.  After application of more topical lidocaine, the vocal cords were passed and the trachea was entered.  The tracheal rings looked normal and the carina looked normal.  After application of more topical lidocaine, a detailed airway exam was done initially on the right side of the right main bronchus, bronchus intermedius, right upper lobe takeoff and subsegments, right middle lobe takeoff and subsegments, the right lower lobe takeoff and subsegments were all normal.  Attention turned to the left side, again, the left main bronchus, left upper lobe takeoff and subsegments, left lower lobe lingula takeoff and subsegments, left lower lobe takeoff and subsegments were all normal.  Essentially, a normal airway exam.  After this, some more lidocaine was applied.  The left lower lobe dependent segments were wedged and 40 mL x3 bronchoalveolar lavage was done, total return was 40 mL.  Pink, brownish, mildly cloudy return was obtained and collected for analysis.  At this time, bronchoalveolar lavage was terminated.  Fluoroscopy was applied and transbronchial biopsies were done  in the left lower lobe.  Of note, I had particular difficulty getting biopsy pieces a total of 6-7 attempts were done.  Most of them returned without any tissue with somewhat like fragments.  I had to pass the forceps a little bit more distally almost to the edge of the pleura to get any significant biopsy pieces.  Total transbronchial biopsy 3 pieces from the left lower lobe dependent segment and lateral segments were obtained.  This is being sent for analysis.  IMPRESSION: 1. Normal airway exam. 2. Return of pink, brownish, cloudy bronchoalveolar  returns which is     not normal. 3. Transbronchial biopsies x3 of the left lower lobe.  PLAN:  Attempt bronchial washing for cell count differential, CD-4/CD-8 ratio, Gram stain and other parameters.  Transbronchial biopsy from the left lower lobe for pathology.  POSTPROCEDURE PLAN: 1. Observe in the recovery room. 2. Get chest x-ray. 3. Follow up labs depending on the results.  He might or might not     need open lung biopsy.  We have communicated this with him and his     wife, Adam Chang and they fully understand.     Kalman Shan, MD     MR/MEDQ  D:  08/31/2010  T:  08/31/2010  Job:  [id number]  Electronically Signed by Kalman Shan MD on 09/05/2010 08:44:05 AM

## 2010-09-06 ENCOUNTER — Telehealth: Payer: Self-pay | Admitting: *Deleted

## 2010-09-06 ENCOUNTER — Telehealth: Payer: Self-pay | Admitting: Internal Medicine

## 2010-09-06 NOTE — Telephone Encounter (Signed)
I re-sent forms down to [employer] again because the original forms cannot be located. I placed a note on the forms that they needed to be completed Asap.  I called the pt company [employer] and they are aware of the issue and are okay to wait for forms. I will check with [employer] in [place] to make sure they have the forms. Carron Curie, CMA

## 2010-09-06 NOTE — Telephone Encounter (Signed)
Patient request records to bring to another physician's office. I printed them out, along with a ROI, and they are waiting to be picked up.

## 2010-09-07 ENCOUNTER — Encounter: Payer: Self-pay | Admitting: Adult Health

## 2010-09-07 ENCOUNTER — Ambulatory Visit (INDEPENDENT_AMBULATORY_CARE_PROVIDER_SITE_OTHER)
Admission: RE | Admit: 2010-09-07 | Discharge: 2010-09-07 | Disposition: A | Discharge status: Home / Self Care | Payer: 59 | Source: Ambulatory Visit | Attending: Adult Health | Admitting: Adult Health

## 2010-09-07 ENCOUNTER — Encounter: Payer: Self-pay | Admitting: *Deleted

## 2010-09-07 ENCOUNTER — Ambulatory Visit (INDEPENDENT_AMBULATORY_CARE_PROVIDER_SITE_OTHER): Payer: 59 | Admitting: Adult Health

## 2010-09-07 VITALS — BP 114/86 | HR 86 | Temp 98.8°F | Ht 69.0 in | Wt 190.4 lb

## 2010-09-07 DIAGNOSIS — J939 Pneumothorax, unspecified: Secondary | ICD-10-CM | POA: Insufficient documentation

## 2010-09-07 DIAGNOSIS — R0902 Hypoxemia: Secondary | ICD-10-CM

## 2010-09-07 DIAGNOSIS — J9383 Other pneumothorax: Secondary | ICD-10-CM

## 2010-09-07 NOTE — Patient Instructions (Signed)
Continue on current regimen.  Follow up as planned with Dr. Marchelle Gearing  Call if sutures not removed next week, I will work in to remove Wear Oxygen with activity and sleep  Please contact office for sooner follow up if symptoms do not improve or worsen or seek emergency care

## 2010-09-09 LAB — HYPERSENSITIVITY PNUEMONITIS PROFILE
A fumigatus #1: NOT DETECTED
A pullulans: NOT DETECTED
Faenia retivirgula: NOT DETECTED
Thermoactinomyces vulgaris, IgG: NOT DETECTED

## 2010-09-10 ENCOUNTER — Inpatient Hospital Stay: Payer: 59 | Admitting: Adult Health

## 2010-09-13 ENCOUNTER — Telehealth: Payer: Self-pay | Admitting: Internal Medicine

## 2010-09-13 NOTE — Telephone Encounter (Signed)
Pt came by while [name] was at lunch I printed  Below note as directed and gave to patient.Adam Chang

## 2010-09-13 NOTE — Telephone Encounter (Signed)
I called Adam Chang: You can print this conversation alone and give to him for pick up ° °He is using a hot tub daily x 5-6 years but never used it as a jacuzzi though wife use it as a jacuzzi. I will alert Dr Govert know abut this °Blood test - aspergillus band noted. Fungal BAL - cladosporium grown; unclear significance. Please send fungal BAL 5/11  to and the HP panel 5/14 to Dr. Govert via fax ° °DDX explained to patient °1. Hypersensitivity Pneumonitis °2. BOOP °3. Aspiration °4. Hot tub lung ° °PLAN °Wait to see what HP panel for[facility] shows °Stop using humidifer °Stop using hot tub °Followup depending on HP palnel from [facility]; Dr Govert wil email me resu;lts but just give appt in  2-3 weeks. Keep appt 09/20/2010. Will discuss GERD issues °Closely monitor ° °He has to think about lung bx versus prednisone trial °

## 2010-09-13 NOTE — Telephone Encounter (Signed)
I called Mr Feltes: You can print this conversation alone and give to him for pick up ° °He is using a hot tub daily x 5-6 years but never used it as a jacuzzi though wife use it as a jacuzzi. I will alert Dr Govert know abut this °Blood test - aspergillus band noted. Fungal BAL - cladosporium grown; unclear significance. Please send fungal BAL 5/11  to and the HP panel 5/14 to Dr. Govert via fax ° °DDX explained to patient °1. Hypersensitivity Pneumonitis °2. BOOP °3. Aspiration °4. Hot tub lung ° °PLAN °Wait to see what HP panel for[facility] shows °Stop using humidifer °Stop using hot tub °Followup depending on HP palnel from [facility]; Dr Govert wil email me resu;lts but just give appt in  2-3 weeks. Keep appt 09/20/2010. Will discuss GERD issues °Closely monitor ° °He has to think about lung bx versus prednisone trial °

## 2010-09-13 NOTE — Telephone Encounter (Signed)
Mr the pt is requesting results from [facility] on 08-31-10. Please advise.Carron Curie, CMA

## 2010-09-13 NOTE — Telephone Encounter (Signed)
I thought I had NP [facility] make appt with me for fu this week to discuss results. When is he seeing me ? I discussed his path and issues with 3 intl experts in [place] so I have a lot to talk to him about

## 2010-09-13 NOTE — Assessment & Plan Note (Signed)
Cont on current regimen follow up [facility] next week  follow up Dr. Marchelle Gearing in 1 -2 weeks as planned

## 2010-09-13 NOTE — Assessment & Plan Note (Signed)
Resolved on chest xray today Plan to remove sutures next week He has ov with [facility] next week, if not removed there will return here for follow up

## 2010-09-13 NOTE — Telephone Encounter (Signed)
I called Adam Chang: [name] can print this conversation alone and give to him for pick up  He is using a hot tub daily x 5-6 years but never used it as a [place] though wife use it as a Financial controller. I will alert Dr Katrine Coho know abut this Blood test - aspergillus band noted. Fungal BAL - cladosporium grown; unclear significance. Please send fungal BAL 5/11  to and the HP panel 5/14 to Dr. Katrine Coho via fax  DDX explained to patient 1. Hypersensitivity Pneumonitis 2. BOOP 3. Aspiration 4. Hot tub lung  PLAN Wait to see what HP panel for[facility] shows Stop using humidifer Stop using hot tub Followup depending on HP palnel from [facility]; Dr Katrine Coho wil email me resu;lts but just give appt in  2-3 weeks. Keep appt 09/20/2010. Will discuss GERD issues Closely monitor  He has to think about lung bx versus prednisone trial

## 2010-09-13 NOTE — Telephone Encounter (Signed)
Fax number - Dr Gabriel Rung Govert:  [phone]. He wants the path report from Dr. Adrienne Mocha which should have been faxed in 1-2 days ago. Please also fax the hypersensitivity pneumonitis panel to him in echart. Thsi should be in echart now.   I spoke to Dr. Katrine Coho: he sent some extra blood test to [facility]. Based on this VATS lung biopsy or not. Patient also thinking about this. He wants me to hold off  giving steroids till these results are back and he and I discuss the results. Thereofer, I can either see patient to revieew conversation with Dr. Katrine Coho or talk over phone (slight change in plan from prior phone note)

## 2010-09-13 NOTE — Telephone Encounter (Signed)
I called Mr Adam Chang: You can print this conversation alone and give to him for pick up ° °He is using a hot tub daily x 5-6 years but never used it as a jacuzzi though wife use it as a jacuzzi. I will alert Dr Govert know abut this °Blood test - aspergillus band noted. Fungal BAL - cladosporium grown; unclear significance. Please send fungal BAL 5/11  to and the HP panel 5/14 to Dr. Govert via fax ° °DDX explained to patient °1. Hypersensitivity Pneumonitis °2. BOOP °3. Aspiration °4. Hot tub lung ° °PLAN °Wait to see what HP panel for[facility] shows °Stop using humidifer °Stop using hot tub °Followup depending on HP palnel from [facility]; Dr Govert wil email me resu;lts but just give appt in  2-3 weeks. Keep appt 09/20/2010. Will discuss GERD issues °Closely monitor ° °He has to think about lung bx versus prednisone trial °

## 2010-09-13 NOTE — Progress Notes (Signed)
Subjective:    Patient ID: Adam Chang, male    DOB: [birth year], 50 y.o.   MRN: [id number]  HPI OV 08/10/2010: Followup idiopathic hypoxemia. Here today to review test results. So far workup suggesting possible PFO. TEE pending per wife. However, complaining past week feeling feverish, runny nose, post nasal drip, cough, wheeze. Feels sick. No chills, sputum. Walking 5 miles and notes desaturation to 87% but keeps going. Denies other complaints. There is a new rash in right hip area which he was not aware of till my exam; does not have pain and denies poison ivy exposure. WE did spirometry today and is normal; fev1 4.48L/117% predicted (note recent methacholine challenge was negative). RX: ACUTE SINUSITIS, RASH - see DR DAUB, Get TEE and then CPST  OV 08/27/2010: In interim, had TEE 08/14/2010 - showed small r->L shunt with early bubbles c/w PFO but Dr. Teressa Lower does not think this explains hypoxemia. Had skin biopsy of rt infr-axillary rash several days ago and is awating biopsy result. In interim, symptoms returned/worse. Had simple exertional pulse ox and abg on Aug 22, 2010 (cpst machine metabolic cart not working). He desaturated on treadmill run and did not correct with o2 or only mildly corrected (see ABGs) but this was not as pronounced on sitting position in bike but recurred again on bike. The type of dyspnea he complains is c/w with this observation. Wife says when he bends over not that dyspneic but otherwise very dyspneic. STill going for daily walks but even simple exertion can make him very dyspneic. Very concerned. Sleeping all the time, Tired all the time. Says he is not anxious. Also, tripped easily in bathroom.  Dr. Cleta Alberts called me several days ago and thought exam had crackles +  [facility]  Pt presents for follow up of Pneumothorax. Pt was admitted 5/14-5/16/12 for left sided iatrogenic pneumothorax. Pt was undergoing extensive workup for unexplained  Acute onset of hypoxia,  dyspnea, rash. See notes above. He underwent FOB on 08/31/10  Path was neg for malignancy , neg AFB, fungus, leoginella.   Post procedure xray was neg. He developed left sided chest pain on 5/14 and went to ER. Xray showed 50% Pneumothorax. He was admitted and chest tube was placed. Lung reexpanded and chest tube was removed 5/16. Today chest xray shows no pneumothorax.  NO redness or drainage at site. NO fever.  Vasculitis workup has revealed a positive ANA w/ neg titters w/ remainder of vasculitis workup neg. CT shows diffuse ground glass opacification   He still remains very dyspneic with activity. Desats with O2. NO chest pain.    Review of Systems Constitutional:  Feels tired + HEENT:   No headaches,  Difficulty swallowing,  Tooth/dental problems,  Sore throat,                No sneezing, itching, ear ache, nasal congestion, post nasal drip,   CV:  No chest pain,  Orthopnea, PND, swelling in lower extremities, anasarca, palpitations  GI  No heartburn, indigestion, abdominal pain, nausea, vomiting, diarrhea, change in bowel habits, loss of appetite  Resp: .  No excess mucus, no productive cough,  No non-productive cough,  No coughing up of blood.  No change in color of mucus.  No wheezing.  No chest wall deformity  Skin: +rash   GU: no dysuria, change in color of urine, no urgency or frequency.  No flank pain.  MS:  No joint pain or swelling.  No decreased range of motion.  No back pain.  Psych:  No change in mood or affect. No depression or anxiety.  No memory loss.      Objective:   Physical Exam  Nursing note and vitals reviewed. Constitutional: He is oriented to person, place, and time. He appears well-developed and well-nourished. No distress.  HENT:  Head: Normocephalic and atraumatic.  Right Ear: External ear normal.  Left Ear: External ear normal.  Mouth/Throat: Oropharynx is clear and moist. No oropharyngeal exudate.  Eyes: Conjunctivae and EOM are normal. Pupils  are equal, round, and reactive to light. Right eye exhibits no discharge. Left eye exhibits no discharge. No scleral icterus.  Neck: Normal range of motion. Neck supple. No JVD present. No tracheal deviation present. No thyromegaly present.  Cardiovascular: Normal rate, regular rhythm and intact distal pulses.  Exam reveals no gallop and no friction rub.   No murmur heard. Pulmonary/Chest: Effort normal. No respiratory distress. He has no wheezes. He has no rales. He exhibits no tenderness.  Abdominal: Soft. Bowel sounds are normal. He exhibits no distension and no mass. There is no tenderness. There is no rebound and no guarding.  Musculoskeletal: Normal range of motion. He exhibits no tenderness.  Lymphadenopathy:    He has no cervical adenopathy.  Neurological: He is alert and oriented to person, place, and time. He has normal reflexes. No cranial nerve deficit. Coordination normal.  Skin: Skin is warm and dry. Rash noted. He is not diaphoretic. No erythema. No pallor.  Psychiatric: He has a normal mood and affect. His behavior is normal. Judgment and thought content normal.   Sutures in place, no drainage noted. Incision still sl.  open.       Assessment & Plan:

## 2010-09-13 NOTE — Telephone Encounter (Signed)
I will forward to MR to address. Carron Curie, CMA

## 2010-09-14 NOTE — Telephone Encounter (Signed)
Pt has an appt on 09-20-10. Carron Curie, CMA

## 2010-09-18 ENCOUNTER — Encounter: Payer: Self-pay | Admitting: Internal Medicine

## 2010-09-20 ENCOUNTER — Encounter: Payer: Self-pay | Admitting: Internal Medicine

## 2010-09-20 ENCOUNTER — Ambulatory Visit (INDEPENDENT_AMBULATORY_CARE_PROVIDER_SITE_OTHER): Payer: 59 | Admitting: Internal Medicine

## 2010-09-20 ENCOUNTER — Encounter: Payer: Self-pay | Admitting: *Deleted

## 2010-09-20 ENCOUNTER — Telehealth: Payer: Self-pay | Admitting: Internal Medicine

## 2010-09-20 VITALS — BP 122/82 | HR 77 | Temp 97.8°F | Ht 69.0 in | Wt 189.6 lb

## 2010-09-20 DIAGNOSIS — R0902 Hypoxemia: Secondary | ICD-10-CM

## 2010-09-20 NOTE — Progress Notes (Signed)
Subjective:    Patient ID: Adam Chang, male    DOB: 1960-07-11, 50 y.o.   MRN: [id number]  HPI  OV 09/20/2010: Followup after bronch mid-may 2012 complicated by left pneumothorax. IN interim, he saw Dr. Katrine Coho at [facility] who I have d/w over phone and feels HP is top on list. Pneumothorax has resolved. Dr Katrine Coho removed the sutures. Here to review lab results. Bronch BAL LLL - shows > 70% neutrophils and 22% lymphs with CD4:CD8 ratio 2:1. The cutlure has isolated CLADOSPORIUM (? Wood mold). The LLL TRansbronchial biopsy (Read by Adrienne Mocha who I personally spoke to) shows chronic inflmmation c/w COP but she does see some multinucleated giant cells though no definite granuloma (? Sampling issue due to TBBx nature that granuloma not seen). 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  He is non compliant with o2 use and gets tired easily. His exertional desaturatin is unchanged. Dropped pulse ox to 88% at 185 feet x 3 laps (similar to last visit) but still walking miles without o2. I learned last week over phone 3 possible HP exposures. All at home; none at work. Uses hot tub daily for years. Been using humdifier since Oct/Nov 2011. Cutting wood with mold since dec 2011. He reports having stopped all these activities past week and that raspy voice has resolved. However, exertional desaturation is unchanged and I am not convinced he is not going near the wood with mold (I see poison ivy rash on him)   Review of Systems  Constitutional: Negative for fever and unexpected weight change.  HENT: Negative for ear pain, nosebleeds, congestion, sore throat, rhinorrhea, sneezing, trouble swallowing, dental problem, postnasal drip and sinus pressure.   Eyes: Negative for redness and itching.  Respiratory: Positive for shortness of breath. Negative for cough, chest tightness and wheezing.   Cardiovascular: Negative for palpitations  and leg swelling.  Gastrointestinal: Negative for nausea and vomiting.  Genitourinary: Negative for dysuria.  Musculoskeletal: Negative for joint swelling.  Skin: Positive for rash.       Poison ivy in forearm  Neurological: Negative for headaches.  Hematological: Does not bruise/bleed easily.  Psychiatric/Behavioral: Negative for dysphoric mood. The patient is not nervous/anxious.        Objective:   Physical Exam    Physical Exam  Nursing note and vitals reviewed.  Constitutional: He is oriented to person, place, and time. He appears well-developed and well-nourished. No distress.  HENT:  Head: Normocephalic and atraumatic.  Right Ear: External ear normal.  Left Ear: External ear normal.  Mouth/Throat: Oropharynx is clear and moist. No oropharyngeal exudate.  Eyes: Conjunctivae and EOM are normal. Pupils are equal, round, and reactive to light. Right eye exhibits no discharge. Left eye exhibits no discharge. No scleral icterus.  Neck: Normal range of motion. Neck supple. No JVD present. No tracheal deviation present. No thyromegaly present.  Cardiovascular: Normal rate, regular rhythm and intact distal pulses. Exam reveals no gallop and no friction rub.  No murmur heard.  Pulmonary/Chest: Effort normal. No respiratory distress. He has no wheezes. RALES ++. He exhibits no tenderness.  Abdominal: Soft. Bowel sounds are normal. He exhibits no distension and no mass. There is no tenderness. There is no rebound and no guarding.  Musculoskeletal: Normal range of motion. He exhibits no tenderness.  Lymphadenopathy:  He has no cervical adenopathy.  Neurological: He is alert and oriented to person, place, and time. He has  normal reflexes. No cranial nerve deficit. Coordination normal.  Skin: Skin is warm and dry. Rash noted in forearms (POISON IVY). He is not diaphoretic. No erythema. No pallor.  Psychiatric: He has a normal mood and affect. His behavior is normal. Judgment and thought  content normal.            Assessment & Plan:

## 2010-09-20 NOTE — Telephone Encounter (Signed)
Pt aware to keep appt today with MR to discuss bronch results.

## 2010-09-20 NOTE — Progress Notes (Signed)
SATURATION QUALIFICATIONS:  Patient Saturations on Room Air at Rest = 92%  Patient Saturations on Room Air while Ambulating = 88%  Patient Saturations on 2 Liters of oxygen while Ambulating = 94%

## 2010-09-20 NOTE — Patient Instructions (Addendum)
I will talk to Dr. Katrine Coho and call you about starting prednisone. ....................Marland Kitchen Advised to see dr. Katrine Coho week of 09/25/2010 about starting prednisone and diagnosis

## 2010-09-24 ENCOUNTER — Encounter: Payer: Self-pay | Admitting: Internal Medicine

## 2010-09-24 NOTE — Assessment & Plan Note (Signed)
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 Odd features are the lower lobe distribution and the neutrophil predominant BAL. However, he has numerous potential exposures at  Home (hot tub, humidifier since oct/nov 2011, and mold exposure cutting wood since dec 2011). The biopsy is not inconsistent with HP and in fact some multinucleated cells are seen. DDx of BOOP (COP), Aspiration Pneumonitis explained but I doubt these now  He states raspy voice is cleared up after he removed humidifer last week but there is no changed in crackles on exa. Still concerned he has outdoor mold exposure with wood logs (poison ivy on hands). Not using o2 as advised  I d/w hm and wife additional issues  A) explained that elimination of antigen exposure is KEY to mgmt. I am concerned he is not listening to advice. He is not wearing o2 with exertion. He is still doing work in [employer] (has poison ivy rash on him).  Explained that progression can result in permanent fibrosis  B) explained that he will need long term prednisone (3-12 months). REturn to work depends on response. Side effects explained. They expressed that he suffers from mood changes, etc., and might have to live separate from wife for duration of Rx  C) discussed VATS bx - he is undecided. Prefers certainty in diagnosis but weighs it against time and pain involved and possiblity might still not know HP for sure.   D)  I d/w Dr Katrine Coho at [facility] over phone. Extended HP panel test from [facility] is pending. Patient might need mold testing at home and possibly even move out of home. Therefore, and due to above reasons instructed patient to see Dr Katrine Coho as well so we have a consensus plan  E) emphasized o2 use   30 min face to face counseling. Over 50% of time spent in counseling

## 2010-09-28 ENCOUNTER — Telehealth: Payer: Self-pay | Admitting: Internal Medicine

## 2010-09-28 LAB — FUNGUS CULTURE W SMEAR: Fungal Smear: NONE SEEN

## 2010-09-28 NOTE — Telephone Encounter (Signed)
Please find out what he discussed with Dr. Katrine Coho. He can email me. You can give him my email to stay in touch

## 2010-09-28 NOTE — Telephone Encounter (Signed)
Spoke with Adam Chang and he states Dr. Katrine Coho is waiting on lab work results to come back before any decision is made on next step. Adam Chang also wanted MR to know that he has been cutting back on his oxygen use. He states his sats have been avg 93% with activity.  I provided Adam Chang with MR email address as well . Carron Curie, CMA

## 2010-09-28 NOTE — Telephone Encounter (Signed)
[id number].Jennifer Castillo, CMA  

## 2010-10-01 ENCOUNTER — Telehealth: Payer: Self-pay | Admitting: Internal Medicine

## 2010-10-01 NOTE — Telephone Encounter (Signed)
No pfts on record notified Dr. Lacie Draft office.Adam Chang

## 2010-10-03 ENCOUNTER — Telehealth: Payer: Self-pay | Admitting: Internal Medicine

## 2010-10-03 NOTE — Telephone Encounter (Signed)
Pt states he sent you an email about new results for you to review. Carron Curie, CMA

## 2010-10-04 NOTE — Telephone Encounter (Signed)
Please let him know that I did a quick read on his email. Need some time to mull over his questions and also on time cruch today. I will reply by late today or early tomorrow by email.

## 2010-10-04 NOTE — Telephone Encounter (Signed)
Called, spoke with pt.  He is aware MR did receive his email and will reply by late today or early tomorrow via email.  He verbalized understanding of this and will call back if anything further is needed.  Will forward this message to MR as a reminder only.  Once completed, pls sign off.

## 2010-10-05 ENCOUNTER — Telehealth: Payer: Self-pay | Admitting: Internal Medicine

## 2010-10-05 DIAGNOSIS — R0602 Shortness of breath: Secondary | ICD-10-CM

## 2010-10-05 NOTE — Telephone Encounter (Signed)
Ok lot of issues. I had communicated with him most of these and he is still asking questions on same lines  A) his PFTs at [facility] were before he made changes of reducing exposure. Also, he says he is fine now in terms of oxygen. I need objective evidence of where exactly his lung function is before prednisone. 2 days wont make a differnce. HE is to hold prednisone and do tests - I have told him that. Few days of being on prednisone wont change results of tests  B) Insomnia might be due to prednisone. But I cannot be prescribing his xanax for that; he wanted xanax. He did not make ti clear to me he did not sleep because of prednisone. Ok, hold off sleep referral. HE can take otc melatonin 3mg  qhs

## 2010-10-05 NOTE — Telephone Encounter (Signed)
Per MR pt needs to have a PFT and a walk test at the hospital ASAP. Order placed pt aware. Carron Curie, CMA

## 2010-10-05 NOTE — Telephone Encounter (Signed)
OK, so I spoke to the pt to advise him of the need for a PFT and walk test, and a sleep referral. The pt states he started prednisone 2 days ago and wanted to know is he supposed to be off prednisone for this test? If so for how long? Hew also states that he had a PFT at [facility] 3 weeks ago and wanted to know why MR can't look at this test for what he needs?  As, far as the sleep referral the first available is July 19th and the pt stated he would be off prednisone by then. I asked what he meant and he states he is not sleeping because of the prednisone. I advised then maybe a sleep referral is not needed. Pt states before he was on prednisone he slept fine. I advised the pt I will talk to MR to see exactly what is needed.  The work note has been printed and mailed to the pt.   MR, does the patient need to be off prednisone for the pft and walk test? If so for how many days? As of Friday the patient had been on prednisone x 2 days.  Also does the pt need a sleep referral. He states that he did not have any sleep problems until after he started prednisone. I advised this is a side effect of prednisone and a sleep referral will not help with this. Please advise what is needed for this patient. Thanks.Carron Curie, CMA

## 2010-10-05 NOTE — Telephone Encounter (Signed)
Adam Chang, I am sending you this as telephone message to document  A) he needs PFT and a simple 600 feet walk test eithe here or [facility] or [facility] next 1-3 days; then resume prednisone B) needs fu with me with full pft and walk test in 4-6 weeks C) he needs sleep referral for insomnia D) he wants work excuse

## 2010-10-06 ENCOUNTER — Telehealth: Payer: Self-pay | Admitting: Internal Medicine

## 2010-10-06 NOTE — Telephone Encounter (Signed)
Jen,I spopke to him 6/16. BAsed on tat  A) cancel current PFT B) I have told him to continue prednisone based on [facility]over[facility] rec C) cancel sleep appt -  I h ave told him to try melatonin 3mg  qhs. YOu need not tell him D) see me in 3 or 4 weeks with full PFTs, give him appt

## 2010-10-08 ENCOUNTER — Encounter: Payer: Self-pay | Admitting: *Deleted

## 2010-10-08 NOTE — Telephone Encounter (Signed)
appt for PFT and ov with MR is set for 10-23-10. Carron Curie, CMA

## 2010-10-08 NOTE — Telephone Encounter (Signed)
There is another phone note on this matter. I will sign off on this note.Carron Curie, CMA

## 2010-10-23 ENCOUNTER — Encounter: Payer: Self-pay | Admitting: Internal Medicine

## 2010-10-23 ENCOUNTER — Ambulatory Visit (INDEPENDENT_AMBULATORY_CARE_PROVIDER_SITE_OTHER): Payer: 59 | Admitting: Internal Medicine

## 2010-10-23 VITALS — BP 120/78 | HR 82 | Temp 97.8°F | Ht 69.0 in | Wt 191.0 lb

## 2010-10-23 DIAGNOSIS — J679 Hypersensitivity pneumonitis due to unspecified organic dust: Secondary | ICD-10-CM

## 2010-10-23 DIAGNOSIS — R0902 Hypoxemia: Secondary | ICD-10-CM

## 2010-10-23 DIAGNOSIS — Z01811 Encounter for preprocedural respiratory examination: Secondary | ICD-10-CM

## 2010-10-23 LAB — PULMONARY FUNCTION TEST

## 2010-10-23 MED ORDER — PREDNISONE 10 MG PO TABS: ORAL_TABLET | ORAL | Status: DC

## 2010-10-23 NOTE — Progress Notes (Signed)
PFT done today. 

## 2010-10-23 NOTE — Patient Instructions (Signed)
#  hypersensitivity pneumonitis  - starting 10/25/2010 take prednisone 20mg  per day x 3 weeks; then 10mg  prednisone per day x 3 weeks, then 5mg  per day x 3 weeks, then 5mg  alternate day x 3 weeks and stop  - avoid all triggers - my nurse wil lhelp return oxygen ; no need to use it anymore #eye surgery  - okay for eye surgery - low risk  - please have my nurse upload eye doctor name in computer so I can send a not #followup - end august 2012  - spirometry at followp and walking desaturation test

## 2010-10-23 NOTE — Progress Notes (Signed)
Subjective:    Patient ID: Adam Chang, male    DOB: 12/22/1960, 50 y.o.   MRN: [id number]  HPI OV 10/23/2010:  Followup HYPERSENSITIVITY pneumonITIS.  SINCE last visit, we have made a high pre test prob dx of HP after teelphone discussions with Dr. Katrine Coho at [facility]. By early June 2012: he got rid of exposure of humidifier and is not giong near moldy wood. On 10/04/2010 Dr Katrine Coho sen him prednisone and he is now on 40mg  per day. Some days feel down - without aggravating or relioving factors. But overall dyspnea and general health is better. No associated chest pain, tiredness, steroid side effects. Overnight pulse ox self directed at home : normal on 10/20/2010. No exertional desaturations. No dyspnea. On 3rd week of prednisone 40mg  per day (day 21 is 7/5). PFTs today are above normal (DLCO 110% and marked improvement from dlco 60% at [facility]).  Wants to go beyond 6 weeks recommended at [facility] by Dr. Katrine Coho. Wants to continue at pred 40mg  per day. Afraid HP will come back. However, he has removed offending antigens of humidifier, bath tube and wood cutting. Has not done vent mold check and he feels there is no need. He wants to go back to work in august and wants to know if ok. Wants cosmetic eye syurgery and is asking if okay; needs to have it befoe end august. Overall no complaonts.  Past Medical History  Diagnosis Date  . Hypertriglyceridemia   . Seasonal allergies   . Hypoxemia   . Alveolar/parietoalveolar pneumonopathy, other   . Pneumonitis, hypersensitivity 10/25/2010     Family History  Problem Relation Age of Onset  . Stroke Father 36     History   Social History  . Marital Status: Married    Spouse Name: N/A    Number of Children: N/A  . Years of Education: N/A   Occupational History  . electrician    Social History Main Topics  . Smoking status: Former Smoker -- 0.5 packs/day for 8 years    Types: Cigarettes    Quit date: 04/22/1985  . Smokeless tobacco: Never Used  . Alcohol  Use: 1.2 oz/week    2 Shots of liquor per week  . Drug Use: No  . Sexually Active: Not on file   Other Topics Concern  . Not on file   Social History Narrative   Denies any TB exposure.  Denies having any mold  in the house.  Denies having birds.  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  He denies asbestos exposure.  Denies any  nitrofurantoin or amiodarone intake.      No Known Allergies   Outpatient Prescriptions Prior to Visit  Medication Sig Dispense Refill  . fexofenadine (ALLEGRA) 180 MG tablet Take 180 mg by mouth daily.        . Multiple Vitamins-Minerals (MULTIVITAMIN WITH MINERALS) tablet Take 1 tablet by mouth daily.               Review of Systems  Constitutional: Negative for fever and unexpected weight change.  HENT: Negative for ear pain, nosebleeds, congestion, sore throat, rhinorrhea, sneezing, trouble swallowing, dental problem, postnasal drip and sinus pressure.   Eyes: Negative for redness and itching.  Respiratory: Negative for cough,  chest tightness, shortness of breath and wheezing.   Cardiovascular: Negative for palpitations and leg swelling.  Gastrointestinal: Negative for nausea and vomiting.  Genitourinary: Negative for dysuria.  Musculoskeletal: Negative for joint swelling.  Skin: Negative for rash.  Neurological: Negative for headaches.  Hematological: Does not bruise/bleed easily.  Psychiatric/Behavioral: Negative for dysphoric mood. The patient is not nervous/anxious.        Objective:   Physical Exam     Physical Exam  Nursing note and vitals reviewed.  Constitutional: He is oriented to person, place, and time. He appears well-developed and well-nourished. No distress.  HENT:  Head: Normocephalic and atraumatic.  Right Ear:  External ear normal.  Left Ear: External ear normal.  Mouth/Throat: Oropharynx is clear and moist. No oropharyngeal exudate.  Eyes: Conjunctivae and EOM are normal. Pupils are equal, round, and reactive to light. Right eye exhibits no discharge. Left eye exhibits no discharge. No scleral icterus.  Neck: Normal range of motion. Neck supple. No JVD present. No tracheal deviation present. No thyromegaly present.  Cardiovascular: Normal rate, regular rhythm and intact distal pulses. Exam reveals no gallop and no friction rub.  No murmur heard.  CHEST: Left sided scar + Pulmonary/Chest: Effort normal. No respiratory distress. He has no wheezes. RALES RESOLVED. He exhibits no tenderness.  Abdominal: Soft. Bowel sounds are normal. He exhibits no distension and no mass. There is no tenderness. There is no rebound and no guarding.  Musculoskeletal: Normal range of motion. He exhibits no tenderness.  Lymphadenopathy:  He has no cervical adenopathy.  Neurological: He is alert and oriented to person, place, and time. He has normal reflexes. No cranial nerve deficit. Coordination normal.  Skin: Skin is warm and dry. Rash noted in forearms (POISON IVY) has RESOLVED. He is not diaphoretic. No erythema. No pallor.  Psychiatric: He has a normal mood and affect. His behavior is normal. Judgment and thought content normal.         Assessment & Plan:

## 2010-10-25 ENCOUNTER — Telehealth: Payer: Self-pay | Admitting: Internal Medicine

## 2010-10-25 ENCOUNTER — Encounter: Payer: Self-pay | Admitting: Internal Medicine

## 2010-10-25 DIAGNOSIS — Z01811 Encounter for preprocedural respiratory examination: Secondary | ICD-10-CM | POA: Insufficient documentation

## 2010-10-25 DIAGNOSIS — J679 Hypersensitivity pneumonitis due to unspecified organic dust: Secondary | ICD-10-CM

## 2010-10-25 HISTORY — DX: Hypersensitivity pneumonitis due to unspecified organic dust: J67.9

## 2010-10-25 NOTE — Telephone Encounter (Signed)
Adam Chang, Plse send OV to eye doctor (see email) and Dr. Katrine Coho at [facility]

## 2010-10-25 NOTE — Assessment & Plan Note (Signed)
#  hypersensitivity pneumonitis - D21 prednisone is 10/25/2010. He has remarkably improved. Exam has no crackles. PFTs have normalized. Walkign desat test is normal at home. He is staying off from offending antigens. He has not done mold testing and does not think that is source; he isnot incline for same  PLAN - given his wish for extended prednisone shared plan is is:  starting 10/25/2010 take prednisone 20mg  per day x 3 weeks; then 10mg  prednisone per day x 3 weeks, then 5mg  per day x 3 weeks, then 5mg  alternate day x 3 weeks and stop  - avoid all triggers (explained to him and wife that this was the most important comonent of HP Rx) - my nurse wil lhelp return oxygen ; no need to use it anymore - if HP recurs, strongly advised home mold test - he agrees - can return to work in August 2012   #followup - end august 2012  - spirometry at followp and walking desaturation test

## 2010-10-25 NOTE — Assessment & Plan Note (Signed)
He wants to [facility] Scleral Spacing Procedure with anesthesia.  for his eyes. He wants it done while on prednisone (no side effects) due to insurance reasons. Does not want to wait beyond august. I think he is at low risk for complications. I do not see a need for stress dose steroids at this stage; he is describing a short procedure and so far has handled prednisone well. Will send this note to his

## 2010-10-26 NOTE — Telephone Encounter (Signed)
Ov faxed to eye doctor and Dr. Katrine Coho. Carron Curie, CMA

## 2010-10-26 NOTE — Telephone Encounter (Signed)
MR, has this been completed?  If so, pls sign off on this phone message.  Thanks!

## 2010-10-31 ENCOUNTER — Telehealth: Payer: Self-pay | Admitting: Internal Medicine

## 2010-10-31 NOTE — Telephone Encounter (Signed)
Looks like the order was sent to [facility] on 10/23/10 to d/c o2. Will forward this to [facility] to check on.  Thanks!

## 2010-10-31 NOTE — Telephone Encounter (Signed)
Pt was called today by [facility]

## 2010-11-06 ENCOUNTER — Telehealth: Payer: Self-pay | Admitting: Internal Medicine

## 2010-11-06 NOTE — Telephone Encounter (Signed)
Pt advised that MR is not in the office until 11-13-10 and I will have him sign at that time. Carron Curie, CMA

## 2010-11-06 NOTE — Telephone Encounter (Signed)
i advised the pt that the OV note has been faxed per his request. Nothing further needed. Carron Curie, CMA

## 2010-11-08 ENCOUNTER — Encounter: Payer: Self-pay | Admitting: Internal Medicine

## 2010-11-08 NOTE — Discharge Summary (Signed)
NAMEHALLIS, MEDITZ             ACCOUNT NO.:  [id number]  MEDICAL RECORD NO.:  [id number]           PATIENT TYPE:  I  LOCATION:  2012                         FACILITY:  [facility]  PHYSICIAN:  Kalman Shan, MD   DATE OF BIRTH:  12/11/1960  DATE OF ADMISSION:  09/03/2010 DATE OF DISCHARGE:  09/05/2010                              DISCHARGE SUMMARY   DISCHARGE DIAGNOSES: 1. Left pneumothorax. 2. Hypoxia.  HISTORY OF PRESENT ILLNESS:  Adam Chang is a 50 year old male who was followed by Dr. Marchelle Gearing in the Pulmonary Clinic for an extensive workup of hypoxia.  The patient was found to have a positive ANA with negative titers and the remainder of his vasculitis workup was negative. He was also noted to have diffuse ground glass opacification on CT scan of the chest.  He underwent a bronchoscopy on Aug 31, 2010, for biopsy and further assessment of his hypoxemia.  Postprocedure chest x-ray was negative for pneumothorax.  However the following 2 days, Adam Chang noticed more left-sided chest pain as well as increased shortness of breath.  He presented to the emergency room on May 14 with the above complaints and chest x-ray showed 50% left pneumothorax.  A 20-French chest tube was placed in the emergency room with resolution of pneumothorax.  LABORATORY DATA:  On admission May 14; CBC, white blood cells 18.1, hemoglobin 15.1, hematocrit 44.1 and platelets 171.  D-dimer 0.28. Basic metabolic panel; sodium 138, potassium 3.8, chloride 102, glucose 110, BUN 20, creatinine 1.04.  May 15; CBC, white blood cells 10.4, hemoglobin 13.9, hematocrit 40.5, platelets 183.  Basic metabolic panel; sodium 136, potassium 3.6, glucose 107, BUN 17, creatinine 0.92.  RADIOLOGY DATA:  On admission May 14, a portable chest x-ray showed 50% left pneumothorax.  Post chest tube placement, chest x-ray shows near complete evacuation of left-sided pneumothorax following chest tube placed and bibasilar  atelectasis.  May 16 at 3:00 p.m., day of discharge, postchest tube removal shows interval left chest tube removal without evidence for pneumothorax and improved aeration at the lung bases.  MICRO DATA:  No micro data available this admission.  HOSPITAL COURSE BY DISCHARGE DIAGNOSES: 1. Left pneumothorax postbronchoscopy and biopsy.  The patient     initially presented with left 50% pneumothorax.  Chest tube was     placed with resolution of pneumothorax.  Chest tube was clamped on     May 15.  Remained clamped for 24 hours and in the morning of May     16, chest tube was discontinued.  Followup chest x-ray on the     afternoon of May 16 showed interval removal of chest x-ray with no     pneumothorax.  Plan is to discharge the patient with close followup     and a chest x-ray in the next few days at [facility] Pulmonary Office 2. Hypoxia.  Again, the patient has been undergoing an extensive     workup for this.  He is okay on room air at rest but desats easily     with any activity.  The patient already has home O2 arranged.  Work  of breathing and O2 saturations appear to be at his baseline.  We     will continue with O2 as needed and add humidification  DISCHARGE MEDICATIONS: 1. Colace 100 mg p.o. nightly p.r.n. 2. Oxycodone/APAP 5/325 1 tablet p.o. q.6 h. p.r.n. for pain. 3. Tussionex syrup 5 mL p.o. q.12 h. p.r.n. as needed for cough.  The     patient was counseled that this causes drowsiness 4. Allegra OTC 1 tablet p.o. daily. 5. Tylenol 650 mg p.o. q.6 h. p.r.n. for pain or fever. 6. Multivitamins 1 tablet p.o. daily.  DISCHARGE ACTIVITY:  The patient is discharged with activity as tolerated.  He is to increase activity slowly and wear O2 with all activity.  DISCHARGE DIET:  No restrictions.  WOUND CARE:  The patient is to keep the left chest tube dressing clean and dry x48 hours, reinforce as needed and dressing and sutures will be evaluated by Rubye Oaks, nurse  practitioner, on Friday May 18.  FOLLOWUP APPOINTMENTS: 1. The patient is to follow up with Rubye Oaks, nurse practitioner,     in Pulmonary Office on May 18 at 2:00 p.m. with a chest x-ray 2. The patient is to follow up with Dr. Marchelle Gearing in the Pulmonary     Office on May 31 at 3:00 p.m.  DISPOSITION:  The patient has met maximum benefit from his inpatient hospitalization.  He is medically cleared and ready for discharge with resolution of iatrogenic left pneumothorax.  The patient is discharged to home.  Greater than 30 minutes was spent on this discharge.     Dirk Dress, NP   ______________________________ Kalman Shan, MD    KW/MEDQ  D:  09/05/2010  T:  09/06/2010  Job:  [id number]  Electronically Signed by Danford Bad N.P. on 09/12/2010 03:02:53 PM Electronically Signed by Kalman Shan MD on 11/08/2010 02:49:30 AM

## 2010-11-12 ENCOUNTER — Telehealth: Payer: Self-pay | Admitting: Internal Medicine

## 2010-11-12 NOTE — Telephone Encounter (Addendum)
LMTCB- this was done on 11/06/10 by [facility]

## 2010-11-12 NOTE — Telephone Encounter (Signed)
Spoke with pt and he states that Dr Katrine Coho is still needing his PFT's faxed. I printed them and faxed them to Dr Katrine Coho at [phone] [facility]. Pt states nothing further needed.

## 2010-11-14 ENCOUNTER — Telehealth: Payer: Self-pay | Admitting: Internal Medicine

## 2010-11-14 NOTE — Telephone Encounter (Signed)
Per previous phone note on 7/17, Adam Chang was going to give [employer] paperwork to MR on 7/24 when he returned to office to review and sign.  Has this been completed?  Called and spoke with pt to inform him I was checking on the status of his paperwork and will call him back.  Will forward message to MR and Adam Chang to address.  Please advise.  Thanks.

## 2010-11-15 NOTE — Telephone Encounter (Signed)
Pt came to the office this afternoon and was upset that msg has not been addressed. He states needing these forms signed by MR tomorrow at the latest. I advised that the forms are on his desk to be signed, not sure why this was not handled on 11/13/10 when he was here and apologized for the delay. Pt verbalized understanding and wants a call back as soon as this is addressed. I advised will forward to MR to handle this when arrives for pm office tomorrow.

## 2010-11-16 NOTE — Telephone Encounter (Signed)
Paperwork completed and sent to [employer]. I called and spoke with Renee and she states they can give the pt a copy of the paperwork since he is needing it today, but Elease Hashimoto is not here today to send paperwork to employee. I advised ok to give pt a copy of forms. I LMTCBx1 to advise the pt. Carron Curie, CMA

## 2010-11-16 NOTE — Telephone Encounter (Signed)
Pt aware. Jennifer Castillo, CMA  

## 2010-11-22 ENCOUNTER — Telehealth: Payer: Self-pay | Admitting: Internal Medicine

## 2010-11-22 NOTE — Telephone Encounter (Signed)
Adam Chang, he needs another letter. Please take care of it. Thanks,. MR

## 2010-11-22 NOTE — Telephone Encounter (Signed)
Pt stated that he needs a note for him to return back to work.  His primary care doctor gave him a note to return to work but they are also requesting a note from MR.  Pt stated that MR told him that he would do the note for him and for pt to let us know when he needed it.  Please fax note to  ATTN:  Luz Lex at [phone].  thanks

## 2010-11-26 ENCOUNTER — Encounter: Payer: Self-pay | Admitting: *Deleted

## 2010-11-26 NOTE — Telephone Encounter (Signed)
Letter printed and faxed to number given. Pt aware.  Carron Curie, CMA

## 2010-12-28 ENCOUNTER — Ambulatory Visit: Payer: 59 | Admitting: Internal Medicine

## 2011-07-17 ENCOUNTER — Ambulatory Visit (INDEPENDENT_AMBULATORY_CARE_PROVIDER_SITE_OTHER): Payer: 59 | Admitting: Emergency Medicine

## 2011-07-17 ENCOUNTER — Ambulatory Visit: Payer: 59

## 2011-07-17 VITALS — BP 147/90 | HR 100 | Temp 98.1°F | Resp 18 | Ht 68.5 in | Wt 203.0 lb

## 2011-07-17 DIAGNOSIS — R05 Cough: Secondary | ICD-10-CM

## 2011-07-17 DIAGNOSIS — J301 Allergic rhinitis due to pollen: Secondary | ICD-10-CM

## 2011-07-17 MED ORDER — FLUTICASONE PROPIONATE 50 MCG/ACT NA SUSP: 2.0000 | Freq: Every day | NASAL | Status: AC

## 2011-07-17 MED ORDER — PREDNISONE 20 MG PO TABS: ORAL_TABLET | ORAL | Status: DC

## 2011-07-17 NOTE — Progress Notes (Signed)
  Subjective:    Patient ID: Adam Chang, male    DOB: Apr 06, 1961, 51 y.o.   MRN: [id number]  HPI patient has a complicated history. Last year he had problems with persistent hypoxemia and pulmonary infiltrates. He was seen in Dr. Marchelle Gearing had the bowel and then subsequently seen at [facility]. He had a biopsy done of the lung to he subsequently recovered but the illness lasted 2-3 months. His felt okay since that time but still feels like he has some respiratory issues. He presents today with a 2 to three-week history of intermittent head congestion postnasal drip and cough.    Review of Systems he does not have any cardiac or GI complaints at this time. He has been able to work. He  is on Afrin but otherwise not on medicine.     Objective:   Physical Exam  Constitutional: He appears well-developed and well-nourished.  HENT:  Head: Normocephalic.  Eyes: Pupils are equal, round, and reactive to light.  Neck: No JVD present. No tracheal deviation present. No thyromegaly present.  Cardiovascular: Normal rate and normal heart sounds.  Exam reveals no gallop and no friction rub.   No murmur heard. Pulmonary/Chest: Breath sounds normal. No respiratory distress. He has no wheezes. He has no rales. He exhibits no tenderness.  Lymphadenopathy:    He has no cervical adenopathy.    [facility] reading (PRIMARY) by  Dr.Avel Ogawa chest x-ray shows no acute disease. Sinus films reveal mild thickening at the base of the right maxillary sinus but otherwise unremarkable with no air-fluid levels.        Assessment & Plan:  Assessment is that the patient has symptoms that sound most consistent with allergy. He does not appear to have a probably had last year and his pulse ox is 99 today I did not hear  rales on exam.

## 2011-12-10 ENCOUNTER — Ambulatory Visit (INDEPENDENT_AMBULATORY_CARE_PROVIDER_SITE_OTHER): Payer: 59 | Admitting: Emergency Medicine

## 2011-12-10 ENCOUNTER — Encounter: Payer: Self-pay | Admitting: Emergency Medicine

## 2011-12-10 VITALS — BP 138/90 | HR 73 | Temp 98.0°F | Resp 16 | Ht 69.0 in | Wt 194.0 lb

## 2011-12-10 DIAGNOSIS — R5382 Chronic fatigue, unspecified: Secondary | ICD-10-CM

## 2011-12-10 DIAGNOSIS — Z0000 Encounter for general adult medical examination without abnormal findings: Secondary | ICD-10-CM

## 2011-12-10 DIAGNOSIS — M542 Cervicalgia: Secondary | ICD-10-CM

## 2011-12-10 DIAGNOSIS — R5383 Other fatigue: Secondary | ICD-10-CM

## 2011-12-10 LAB — CBC WITH DIFFERENTIAL/PLATELET
Basophils Absolute: 0.1 10*3/uL (ref 0.0–0.1)
Eosinophils Absolute: 0.3 10*3/uL (ref 0.0–0.7)
Eosinophils Relative: 5 % (ref 0–5)
HCT: 45.8 % (ref 39.0–52.0)
Lymphocytes Relative: 25 % (ref 12–46)
Lymphs Abs: 1.6 10*3/uL (ref 0.7–4.0)
MCH: 28.9 pg (ref 26.0–34.0)
MCV: 81.8 fL (ref 78.0–100.0)
Monocytes Absolute: 0.6 10*3/uL (ref 0.1–1.0)
Platelets: 192 10*3/uL (ref 150–400)
RDW: 13.8 % (ref 11.5–15.5)
WBC: 6.2 10*3/uL (ref 4.0–10.5)

## 2011-12-10 LAB — LIPID PANEL
Cholesterol: 244 mg/dL — ABNORMAL HIGH (ref 0–200)
HDL: 33 mg/dL — ABNORMAL LOW (ref >39)
Total CHOL/HDL Ratio: 7.4 Ratio

## 2011-12-10 LAB — COMPREHENSIVE METABOLIC PANEL
BUN: 21 mg/dL (ref 6–23)
CO2: 27 mEq/L (ref 19–32)
Calcium: 9.8 mg/dL (ref 8.4–10.5)
Chloride: 104 mEq/L (ref 96–112)
Creat: 1.18 mg/dL (ref 0.50–1.35)
Total Bilirubin: 0.9 mg/dL (ref 0.3–1.2)

## 2011-12-10 NOTE — Progress Notes (Signed)
@[facility] @  Patient ID: Adam Chang MRN: [id number], DOB: August 03, 1960 51 y.o. Date of Encounter: 12/10/2011, 10:01 AM  Primary Physician: Lucilla Edin, MD  Chief Complaint: Physical (CPE)  HPI: 51 y.o. y/o male with history noted below here for CPE.  Doing well. No issues/complaints.  Review of Systems:  Consitutional: No fever, chills, fatigue, night sweats, lymphadenopathy, or weight changes. Eyes: No visual changes, eye redness, or discharge. ENT/Mouth: Ears: No otalgia, tinnitus, hearing loss, discharge. Nose: No congestion, rhinorrhea, sinus pain, or epistaxis. Throat: No sore throat, post nasal drip, or teeth pain. Cardiovascular: No CP, palpitations, diaphoresis, DOE, edema, orthopnea, PND. Respiratory: No cough, hemoptysis, SOB, or wheezing. He has recovered from his respiratory illness from last year Gastrointestinal: No anorexia, dysphagia, reflux, pain, nausea, vomiting, hematemesis, diarrhea, constipation, BRBPR, or melena. He is up-to-date on his colonoscopy to Genitourinary: No dysuria, frequency, urgency, hematuria, incontinence, nocturia, decreased urinary stream, discharge, impotence, or testicular pain/masses. Musculoskeletal: No decreased ROM, myalgias, stiffness, joint swelling, or weakness. Skin: No rash, erythema, lesion changes, pain, warmth, jaundice, or pruritis. Neurological: No headache, dizziness, syncope, seizures, tremors, memory loss, coordination problems, or paresthesias. Psychological: No anxiety, depression, hallucinations, SI/HI. Endocrine:  , polydipsia, polyphagia, polyuria, or known diabetes. All other systems were reviewed and are otherwise negative.  Past Medical History  Diagnosis Date  . Hypertriglyceridemia   . Seasonal allergies   . Hypoxemia   . Alveolar/parietoalveolar pneumonopathy, other   . Pneumonitis, hypersensitivity 10/25/2010     No past surgical history on file.  Home Meds:  Prior to Admission medications   Medication  Sig Start Date End Date Taking? Authorizing Provider  levocetirizine (XYZAL) 5 MG tablet Take 5 mg by mouth every evening.   Yes Historical Provider, MD  montelukast (SINGULAIR) 10 MG tablet Take 10 mg by mouth at bedtime.   Yes Historical Provider, MD  Multiple Vitamins-Minerals (MULTIVITAMIN WITH MINERALS) tablet Take 1 tablet by mouth daily.     Yes Historical Provider, MD  fexofenadine (ALLEGRA) 180 MG tablet Take 180 mg by mouth daily.      Historical Provider, MD  fluticasone (FLONASE) 50 MCG/ACT nasal spray Place 2 sprays into the nose daily. 07/17/11 07/16/12  Collene Gobble, MD  predniSONE (DELTASONE) 20 MG tablet Take 3 tablets a day for 3 days 2 tablets a day for 3 days one tablet a day for 3 days 07/17/11   Collene Gobble, MD    Allergies: No Known Allergies  History   Social History  . Marital Status: Married    Spouse Name: N/A    Number of Children: N/A  . Years of Education: N/A   Occupational History  . electrician    Social History Main Topics  . Smoking status: Former Smoker -- 0.5 packs/day for 8 years    Types: Cigarettes    Quit date: 04/22/1985  . Smokeless tobacco: Never Used  . Alcohol Use: 1.2 oz/week    2 Shots of liquor per week  . Drug Use: No  . Sexually Active: Not on file   Other Topics Concern  . Not on file   Social History Narrative   Denies any TB exposure.  Denies having any mold  in the house.  Denies having birds.  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  He denies asbestos exposure.  Denies any  nitrofurantoin or amiodarone intake.     Family History  Problem Relation Age of Onset  . Stroke Father 66    Physical Exam:  Blood pressure 138/90, pulse 73, temperature 98 F (36.7 C), temperature source Oral, resp. rate 16,  height 5\' 9"  (1.753 m), weight 194 lb (87.998 kg), SpO2 96.00%.  General: Well developed, well nourished, in no acute distress. HEENT: Normocephalic, atraumatic. Conjunctiva pink, sclera non-icteric. Pupils 2 mm constricting to 1 mm, round, regular, and equally reactive to light and accomodation. EOMI. Internal auditory canal clear. TMs with good cone of light and without pathology. Nasal mucosa pink. Nares are without discharge. No sinus tenderness. Oral mucosa pink. Dentition . Pharynx without exudate.   Neck: Supple. Trachea midline. No thyromegaly. Full ROM. No lymphadenopathy. Lungs: Clear to auscultation bilaterally without wheezes, rales, or rhonchi. Breathing is of normal effort and unlabored. Cardiovascular: RRR with S1 S2. No murmurs, rubs, or gallops appreciated. Distal pulses 2+ symmetrically. No carotid or abdominal bruits.  Abdomen: Soft, non-tender, non-distended with normoactive bowel sounds. No hepatosplenomegaly or masses. No rebound/guarding. No CVA tenderness. Without hernias.  Rectal: No external hemorrhoids or fissures. Rectal vault without masses.   Genitourinary:   circumcised male. No penile lesions. Testes descended bilaterally, and smooth without tenderness or masses.  Musculoskeletal: Full range of motion and 5/5 strength throughout. Without swelling, atrophy, tenderness, crepitus, or warmth. Extremities without clubbing, cyanosis, or edema. Calves supple. Skin: Warm and moist without erythema, ecchymosis, wounds, or rash. Neuro: A+Ox3. CN II-XII grossly intact. Moves all extremities spontaneously. Full sensation throughout. Normal gait. DTR 2+ throughout upper and lower extremities. Finger to nose intact. Psych:  Responds to questions appropriately with a normal affect.  EKG normal. T down in lead 3  Results for orders placed in visit on 12/10/11  IFOBT (OCCULT BLOOD)      Component Value Range   IFOBT Negative     Studies: CBC, CMET, Lipid, PSA, TSH, Vitamin D  testosterone all pending. Patient is doing well except for chief complaint of fatigue   UA:    Assessment/Plan:  51 y.o. y/o white male here for a complete physical exam. He does have some trouble with his neck and back which have been chronic problems for him but not really debilitating. His biggest complaint today is of fatigue . He denies any other major issues. His respiratory infection he had in the spring of last year has completely resolved and he has no persistent shortness of breath or respiratory complaints. He has not been exercising as much as he has in the past . We'll check his routine labs including a vitamin D and testosterone level to see if this may be the source of some of his fatigue. He is up-to-date on his colonoscopies.    -  Signed, Earl Lites, MD 12/10/2011 10:01 AM

## 2011-12-11 LAB — TESTOSTERONE, FREE, TOTAL, SHBG
Sex Hormone Binding: 19 nmol/L (ref 13–71)
Testosterone: 218.22 ng/dL — ABNORMAL LOW (ref 300–890)

## 2011-12-21 ENCOUNTER — Telehealth: Payer: Self-pay

## 2011-12-21 MED ORDER — ATORVASTATIN CALCIUM 40 MG PO TABS: 40.0000 mg | ORAL_TABLET | Freq: Every day | ORAL | Status: DC

## 2011-12-21 MED ORDER — TESTOSTERONE 30 MG/ACT TD SOLN: 1.0000 | Freq: Every day | TRANSDERMAL | Status: DC

## 2011-12-21 NOTE — Telephone Encounter (Signed)
PATIENT WAS CALLING TO LET DR. DAUB KNOW THAT HE CAN GO AHEAD AND CALL IN THE PRESCRIPTIONS THAT THEY DISCUSSED. USES  [facility]  ON [address].  CALL [phone]

## 2011-12-21 NOTE — Telephone Encounter (Signed)
Meds sent in

## 2011-12-29 ENCOUNTER — Other Ambulatory Visit: Payer: Self-pay | Admitting: Emergency Medicine

## 2011-12-30 DIAGNOSIS — R05 Cough: Secondary | ICD-10-CM | POA: Insufficient documentation

## 2011-12-30 NOTE — Telephone Encounter (Signed)
Ok x 3, or 6?

## 2012-01-31 ENCOUNTER — Encounter: Payer: Self-pay | Admitting: Family Medicine

## 2012-01-31 DIAGNOSIS — R05 Cough: Secondary | ICD-10-CM

## 2012-03-06 ENCOUNTER — Encounter: Payer: Self-pay | Admitting: Physician Assistant

## 2012-03-06 ENCOUNTER — Ambulatory Visit (INDEPENDENT_AMBULATORY_CARE_PROVIDER_SITE_OTHER): Payer: 59 | Admitting: Physician Assistant

## 2012-03-06 VITALS — BP 130/80 | HR 78 | Temp 99.4°F | Resp 16 | Ht 69.0 in | Wt 199.6 lb

## 2012-03-06 DIAGNOSIS — J3489 Other specified disorders of nose and nasal sinuses: Secondary | ICD-10-CM

## 2012-03-06 DIAGNOSIS — R0981 Nasal congestion: Secondary | ICD-10-CM

## 2012-03-06 DIAGNOSIS — E291 Testicular hypofunction: Secondary | ICD-10-CM

## 2012-03-06 DIAGNOSIS — E785 Hyperlipidemia, unspecified: Secondary | ICD-10-CM

## 2012-03-06 DIAGNOSIS — R7989 Other specified abnormal findings of blood chemistry: Secondary | ICD-10-CM

## 2012-03-06 LAB — LIPID PANEL
HDL: 39 mg/dL — ABNORMAL LOW (ref >39)
LDL Cholesterol: 67 mg/dL (ref 0–99)
Total CHOL/HDL Ratio: 3.6 Ratio
VLDL: 34 mg/dL (ref 0–40)

## 2012-03-06 MED ORDER — IPRATROPIUM BROMIDE 0.03 % NA SOLN: 2.0000 | Freq: Two times a day (BID) | NASAL | Status: DC

## 2012-03-06 NOTE — Patient Instructions (Addendum)
Use Atrovent nasal spray twice daily for relief of congestion and post-nasal drainage.  Continue using your daily allergy medication.  I would start using the Flonase again.  Drink plenty of fluids and get plenty of rest.   Practice good hand hygiene to prevent the spread of germs.  Continue using the neti pot to irrigate that nasal passages and sinuses.  Tylenol or Advil for headache.  If you are worsening or not improving, let us know.   Upper Respiratory Infection, Adult An upper respiratory infection (URI) is also sometimes known as the common cold. The upper respiratory tract includes the nose, sinuses, throat, trachea, and bronchi. Bronchi are the airways leading to the lungs. Most people improve within 1 week, but symptoms can last up to 2 weeks. A residual cough may last even longer.  CAUSES Many different viruses can infect the tissues lining the upper respiratory tract. The tissues become irritated and inflamed and often become very moist. Mucus production is also common. A cold is contagious. You can easily spread the virus to others by oral contact. This includes kissing, sharing a glass, coughing, or sneezing. Touching your mouth or nose and then touching a surface, which is then touched by another person, can also spread the virus. SYMPTOMS  Symptoms typically develop 1 to 3 days after you come in contact with a cold virus. Symptoms vary from person to person. They may include:  Runny nose.  Sneezing.  Nasal congestion.  Sinus irritation.  Sore throat.  Loss of voice (laryngitis).  Cough.  Fatigue.  Muscle aches.  Loss of appetite.  Headache.  Low-grade fever. DIAGNOSIS  You might diagnose your own cold based on familiar symptoms, since most people get a cold 2 to 3 times a year. Your caregiver can confirm this based on your exam. Most importantly, your caregiver can check that your symptoms are not due to another disease such as strep throat, sinusitis, pneumonia,  asthma, or epiglottitis. Blood tests, throat tests, and X-rays are not necessary to diagnose a common cold, but they may sometimes be helpful in excluding other more serious diseases. Your caregiver will decide if any further tests are required. RISKS AND COMPLICATIONS  You may be at risk for a more severe case of the common cold if you smoke cigarettes, have chronic heart disease (such as heart failure) or lung disease (such as asthma), or if you have a weakened immune system. The very young and very old are also at risk for more serious infections. Bacterial sinusitis, middle ear infections, and bacterial pneumonia can complicate the common cold. The common cold can worsen asthma and chronic obstructive pulmonary disease (COPD). Sometimes, these complications can require emergency medical care and may be life-threatening. PREVENTION  The best way to protect against getting a cold is to practice good hygiene. Avoid oral or hand contact with people with cold symptoms. Wash your hands often if contact occurs. There is no clear evidence that vitamin C, vitamin E, echinacea, or exercise reduces the chance of developing a cold. However, it is always recommended to get plenty of rest and practice good nutrition. TREATMENT  Treatment is directed at relieving symptoms. There is no cure. Antibiotics are not effective, because the infection is caused by a virus, not by bacteria. Treatment may include:  Increased fluid intake. Sports drinks offer valuable electrolytes, sugars, and fluids.  Breathing heated mist or steam (vaporizer or shower).  Eating chicken soup or other clear broths, and maintaining good nutrition.  Getting plenty  of rest.  Using gargles or lozenges for comfort.  Controlling fevers with ibuprofen or acetaminophen as directed by your caregiver.  Increasing usage of your inhaler if you have asthma. Zinc gel and zinc lozenges, taken in the first 24 hours of the common cold, can shorten the  duration and lessen the severity of symptoms. Pain medicines may help with fever, muscle aches, and throat pain. A variety of non-prescription medicines are available to treat congestion and runny nose. Your caregiver can make recommendations and may suggest nasal or lung inhalers for other symptoms.  HOME CARE INSTRUCTIONS   Only take over-the-counter or prescription medicines for pain, discomfort, or fever as directed by your caregiver.  Use a warm mist humidifier or inhale steam from a shower to increase air moisture. This may keep secretions moist and make it easier to breathe.  Drink enough water and fluids to keep your urine clear or pale yellow.  Rest as needed.  Return to work when your temperature has returned to normal or as your caregiver advises. You may need to stay home longer to avoid infecting others. You can also use a face mask and careful hand washing to prevent spread of the virus. SEEK MEDICAL CARE IF:   After the first few days, you feel you are getting worse rather than better.  You need your caregiver's advice about medicines to control symptoms.  You develop chills, worsening shortness of breath, or brown or red sputum. These may be signs of pneumonia.  You develop yellow or brown nasal discharge or pain in the face, especially when you bend forward. These may be signs of sinusitis.  You develop a fever, swollen neck glands, pain with swallowing, or white areas in the back of your throat. These may be signs of strep throat. SEEK IMMEDIATE MEDICAL CARE IF:   You have a fever.  You develop severe or persistent headache, ear pain, sinus pain, or chest pain.  You develop wheezing, a prolonged cough, cough up blood, or have a change in your usual mucus (if you have chronic lung disease).  You develop sore muscles or a stiff neck. Document Released: 10/02/2000 Document Revised: 07/01/2011 Document Reviewed: 08/10/2010 [facility] Patient Information 2013 [place],  [place].

## 2012-03-06 NOTE — Progress Notes (Signed)
  Subjective:    Patient ID: Adam Chang, male    DOB: 1961/03/29, 51 y.o.   MRN: [id number]  HPI  Adam Chang is a 51 yr old male here with 2-3 days of runny nose, coughing, post-nasal drainage, and headache.  Many people have been sick at work.  He wants to try to "get ahead of this".   His primary symptom right now is nasal congestion and rhinorrhea.  Cough is minimally productive and not bothersome at this point.  He is experiencing headache but no sinus pain or tooth pain.  No ear pain or sore throat.  No GI symptoms.  No fever or chills.  Some body aches, muscle aches. He uses daily allergy medication which he has continued.  Has taken a few sudafed with little relief.     Pt also states that he would like cholesterol and testosterone labs.  Has had markedly elevated triglycerides in the past and was started on lipitor in August.  Has been treated with Axiron for low testosterone since August as well.   Review of Systems  Constitutional: Negative for fever, chills and appetite change.  HENT: Positive for congestion, rhinorrhea, postnasal drip and sinus pressure. Negative for ear pain and sore throat.   Respiratory: Positive for cough. Negative for shortness of breath and wheezing.   Cardiovascular: Negative.   Gastrointestinal: Negative.   Musculoskeletal: Positive for myalgias and arthralgias.  Skin: Negative.   Neurological: Positive for headaches. Negative for dizziness, syncope and light-headedness.       Objective:   Physical Exam  Vitals reviewed. Constitutional: He is oriented to person, place, and time. He appears well-developed and well-nourished. No distress.  HENT:  Head: Normocephalic and atraumatic.  Right Ear: Ear canal normal. Tympanic membrane is retracted.  Left Ear: Tympanic membrane and ear canal normal.  Nose: Rhinorrhea present. Right sinus exhibits no maxillary sinus tenderness and no frontal sinus tenderness. Left sinus exhibits no maxillary sinus tenderness  and no frontal sinus tenderness.  Mouth/Throat: Uvula is midline, oropharynx is clear and moist and mucous membranes are normal.  Neck: Neck supple.  Cardiovascular: Normal rate, regular rhythm, normal heart sounds and intact distal pulses.  Exam reveals no gallop and no friction rub.   No murmur heard. Pulmonary/Chest: Effort normal and breath sounds normal. He has no wheezes. He has no rales.  Lymphadenopathy:    He has no cervical adenopathy.  Neurological: He is alert and oriented to person, place, and time.  Skin: Skin is warm and dry.  Psychiatric: He has a normal mood and affect. His behavior is normal.    Filed Vitals:   03/06/12 0900  BP: 130/80  Pulse: 78  Temp: 99.4 F (37.4 C)  Resp: 16         Assessment & Plan:   1. Nasal congestion  ipratropium (ATROVENT) 0.03 % nasal spray  2. Hyperlipidemia  Lipid panel  3. Low testosterone  Testosterone, free, total    Adam Chang is a 51 yr old male here with URI symptoms, likely viral.  His temp is 99.4 in clinic, but his exam is reassuring.  Will treat with Atrovent nasal spray for symptom relief.  Encouraged him to continue using his allergy medications, including Flonase which has not been using.  Given instructions on home care.  Discussed RTC precautions.  Lipid panel and testosterone levels are pending.  We can adjust therapy as necessary when lab data is back.

## 2012-03-09 LAB — TESTOSTERONE, FREE, TOTAL, SHBG
Sex Hormone Binding: 25 nmol/L (ref 13–71)
Testosterone: 824 ng/dL (ref 300–890)

## 2012-03-26 IMAGING — CR DG CHEST 2V
2 series · 2 of 2 positions shown · non-contrast
Comparison: CT chest of 07/05/2010

CLINICAL DATA: Shortness of breath, elevated white blood cell count

CHEST - 2 VIEW

[w chest pa]
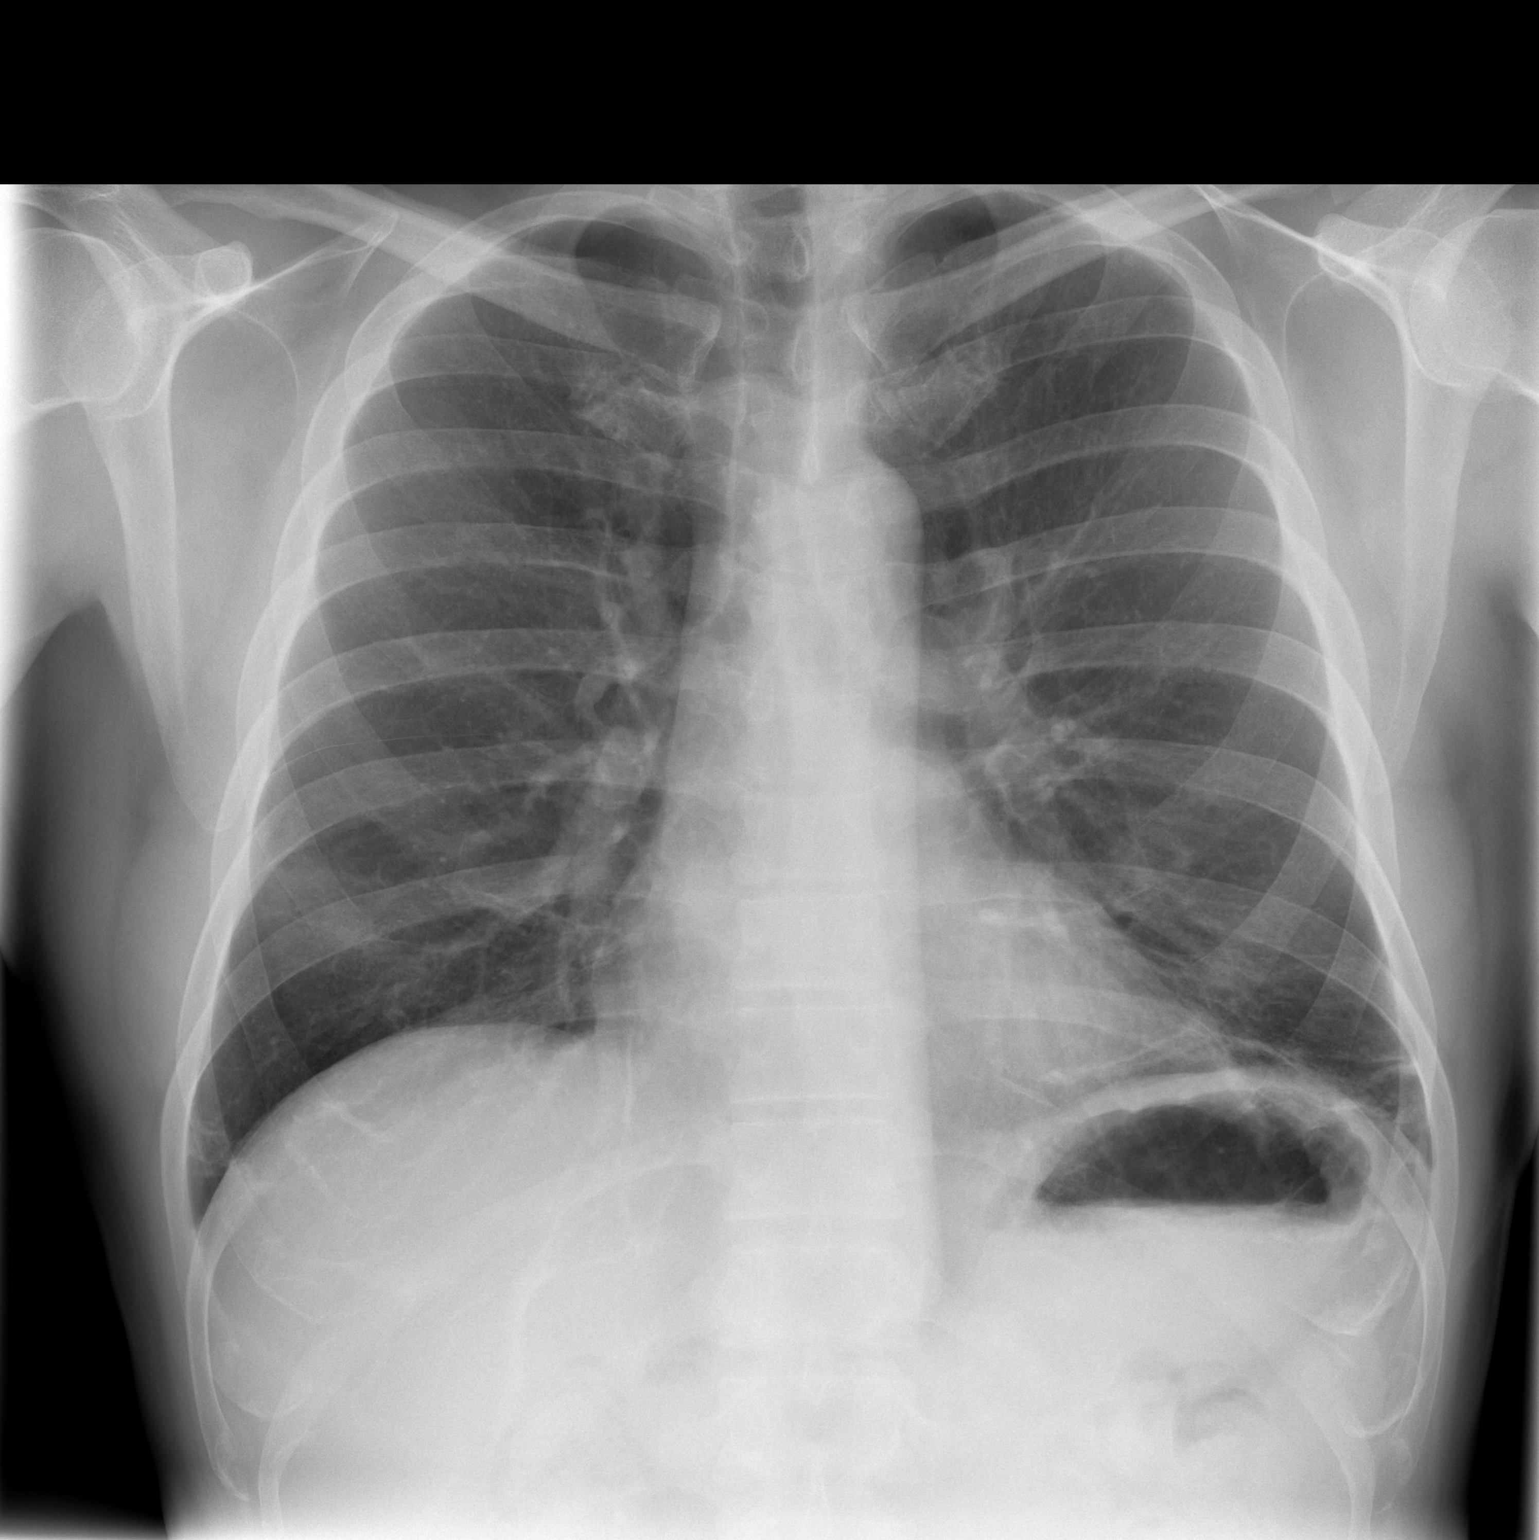

[w chest lat]
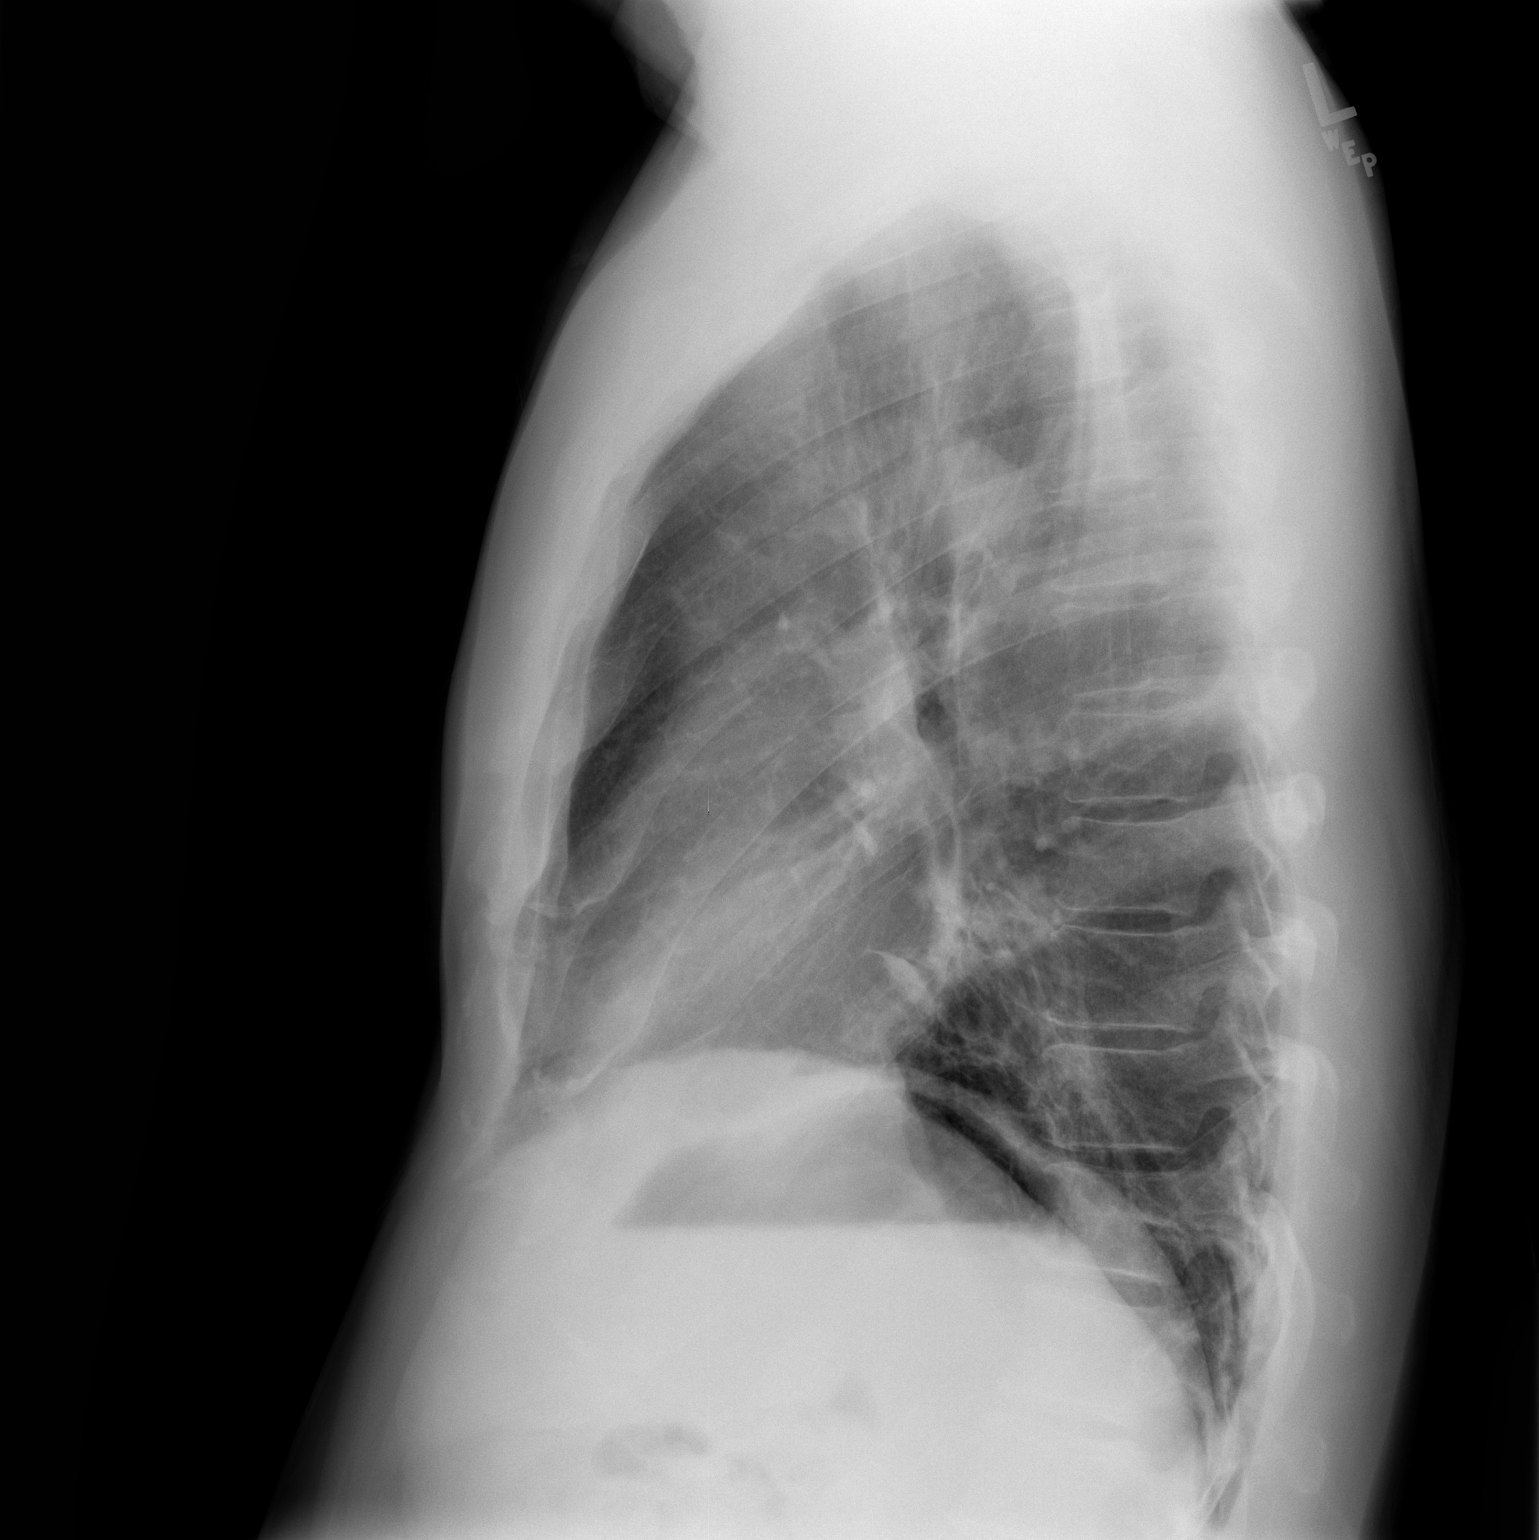

[2 of 2 positions shown; findings below may reference images not displayed]

FINDINGS: Linear scarring or atelectasis is noted in the left lower
lobe.  No active infiltrate or effusion is seen.  Probable mild
atelectasis also is noted at the right lung base.  Mediastinal
contours appear normal.  The heart is within normal limits in size.
No bony abnormality is seen.
IMPRESSION: Mild basilar linear atelectasis or scarring left greater than
right.  No definite active process.

## 2012-03-27 IMAGING — CR DG CHEST 2V
2 series · 2 of 2 positions shown · non-contrast
Comparison: 07/06/2010

CLINICAL DATA: Short of breath

CHEST - 2 VIEW

[w chest pa]
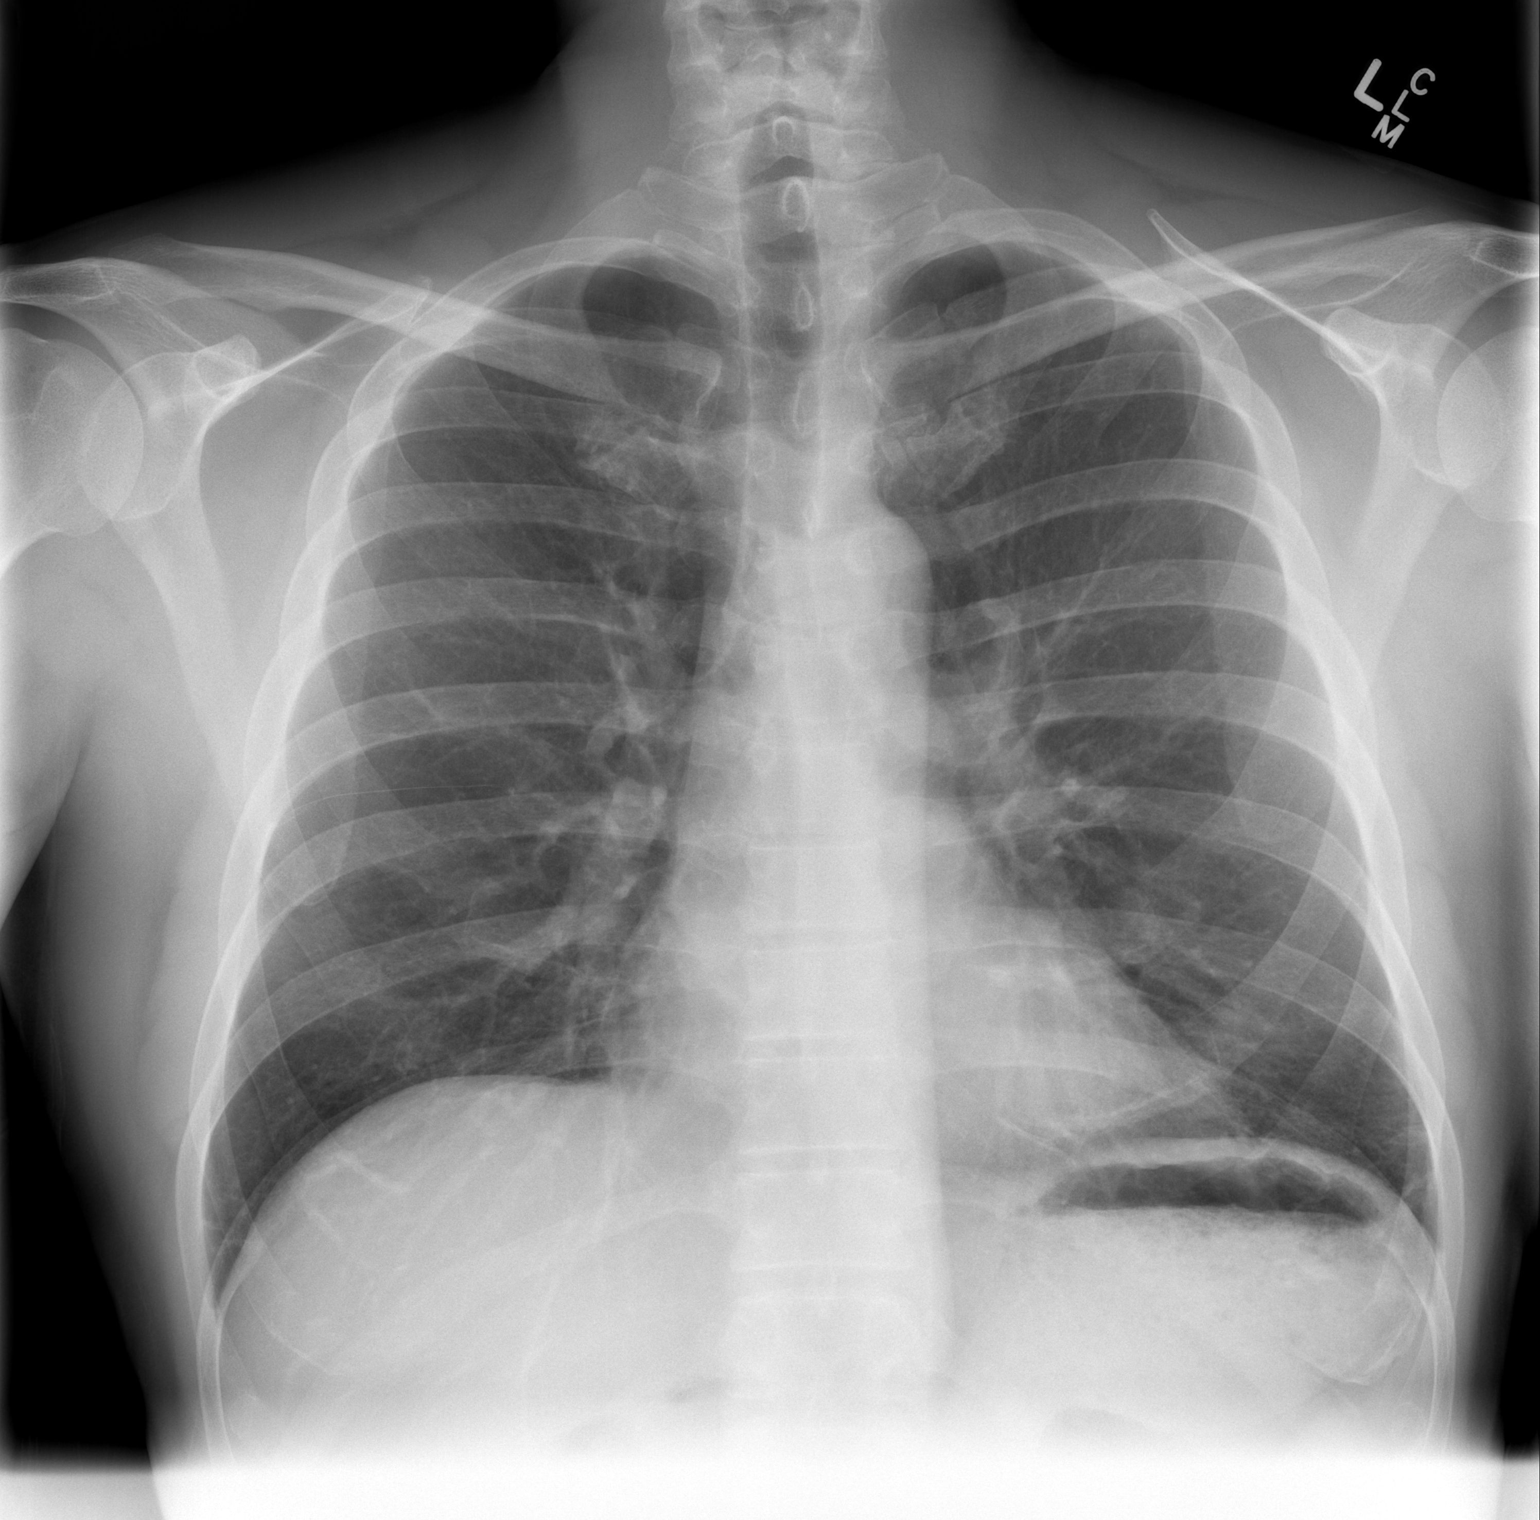

[w chest lat]
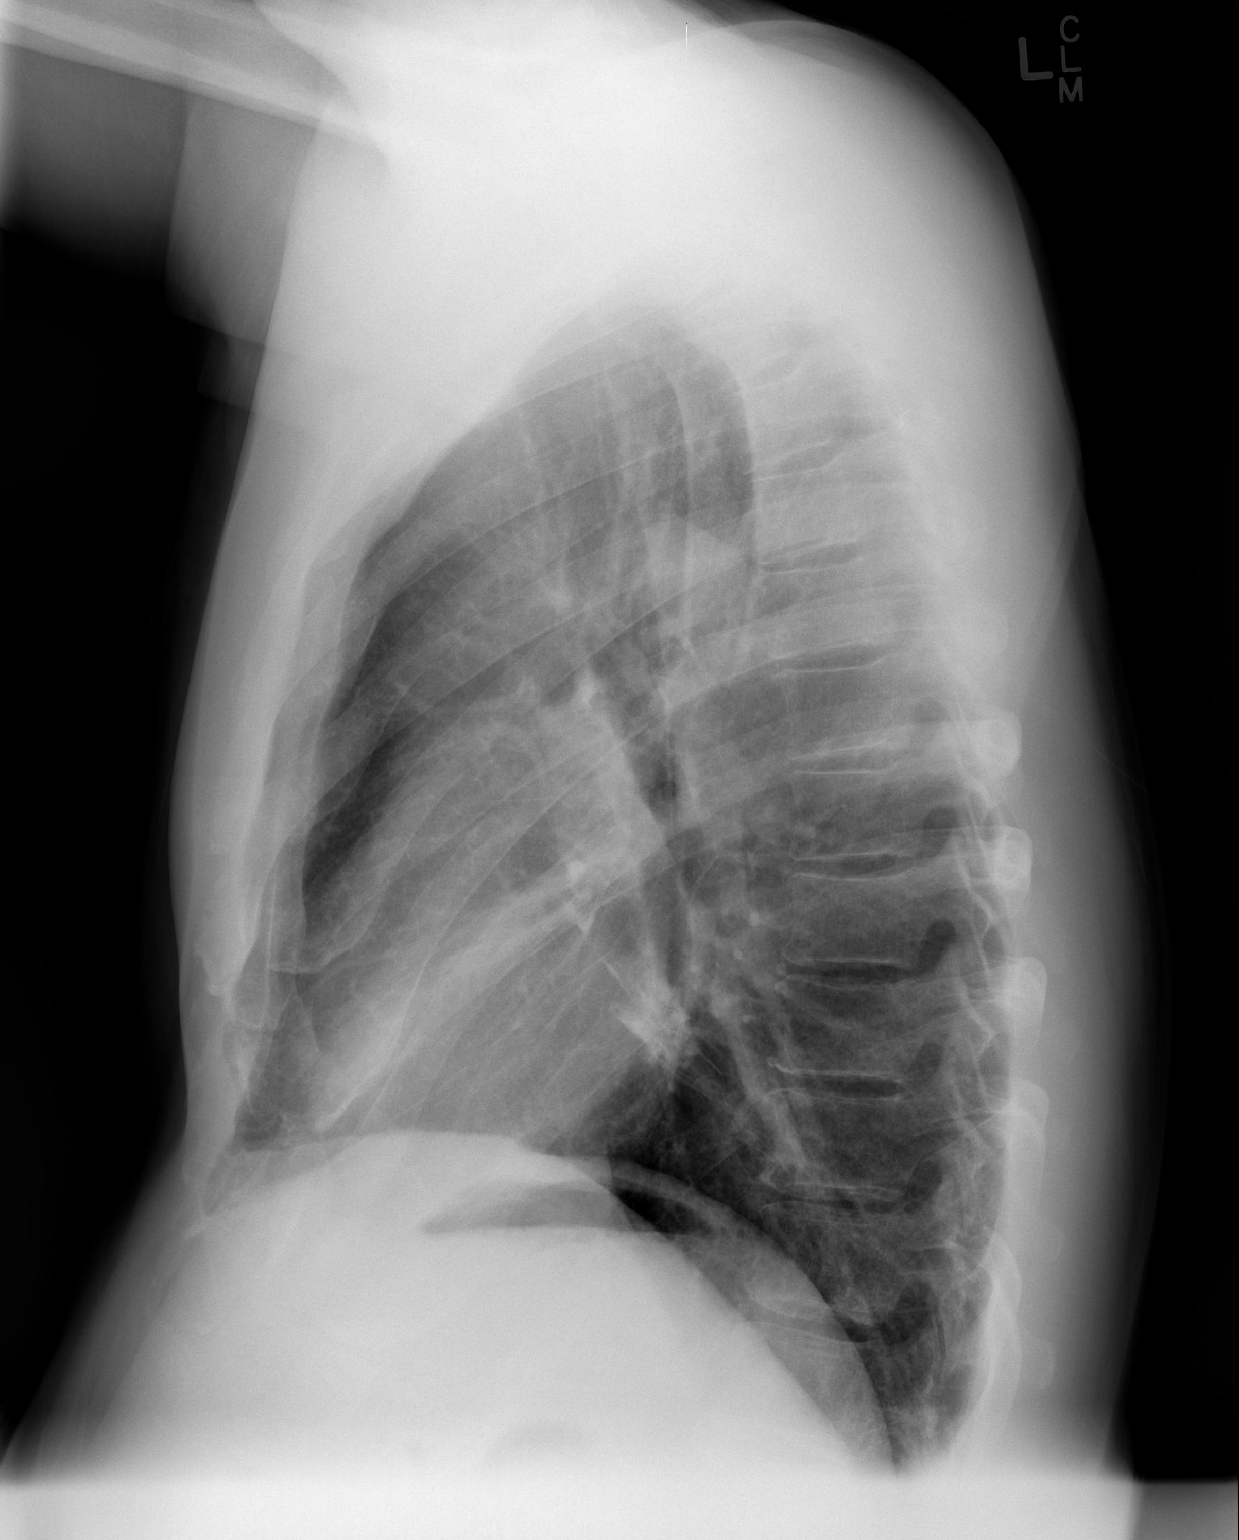

[2 of 2 positions shown; findings below may reference images not displayed]

FINDINGS: Normal heart size.  Left basilar scar.  No new
consolidation.  No pneumothorax.  No pleural effusion.
IMPRESSION: No active cardiopulmonary disease.

## 2012-04-21 ENCOUNTER — Encounter: Payer: Self-pay | Admitting: Emergency Medicine

## 2012-04-21 ENCOUNTER — Ambulatory Visit (INDEPENDENT_AMBULATORY_CARE_PROVIDER_SITE_OTHER): Payer: 59 | Admitting: Emergency Medicine

## 2012-04-21 ENCOUNTER — Ambulatory Visit: Payer: 59

## 2012-04-21 VITALS — BP 128/88 | HR 71 | Temp 98.7°F | Resp 16 | Ht 69.0 in | Wt 209.0 lb

## 2012-04-21 DIAGNOSIS — R0609 Other forms of dyspnea: Secondary | ICD-10-CM

## 2012-04-21 DIAGNOSIS — R0683 Snoring: Secondary | ICD-10-CM

## 2012-04-21 MED ORDER — AZELASTINE HCL 0.1 % NA SOLN: NASAL | Status: DC

## 2012-04-21 NOTE — Progress Notes (Signed)
  Subjective:    Patient ID: Adam Chang, male    DOB: 1961-02-08, 51 y.o.   MRN: [id number]  HPI Pt c/o problems with snoring. His wife complains that he snores. She says that he has stopped breathing during his sleep. He is feeling fatigued during the day. Gotten a lot worse in the last 2 months. He has tried a lot of different OTC nasal sprays, tried mucinex. Tried the nasal strips. Didn't help. PT feels like his nasal passages are tighter. Doesn't feel allergy sx History of hypersensitivity pneumonitis..    Review of Systems History of Hypersensitivity pneumonitis     Objective:   Physical Exam TMs are clear. Nose is normal except for some redness of the turbinates. Posterior pharynx is clear. The uvula is prominent and seems to hang down  significantly to the area of the posterior tongue Neck is supple. Chest is clear to both auscultation and percussion.  [facility] reading (PRIMARY) by  Dr.  Cleta Alberts sinus films are normal      Assessment & Plan:  Patient with recent onset of significant snoring. Will stop his Atrovent and try astelin spray.Marland Kitchen Referral made to Dr. Suszanne Conners for his evaluation.

## 2012-04-23 ENCOUNTER — Encounter: Payer: Self-pay | Admitting: Emergency Medicine

## 2012-05-24 IMAGING — CR DG CHEST 1V PORT
1 series · 1 of 1 positions shown · non-contrast
Comparison: Earlier same date.

CLINICAL DATA: Follow-up pneumothorax following chest tube
placement.

PORTABLE CHEST - 1 VIEW

[view not recorded]
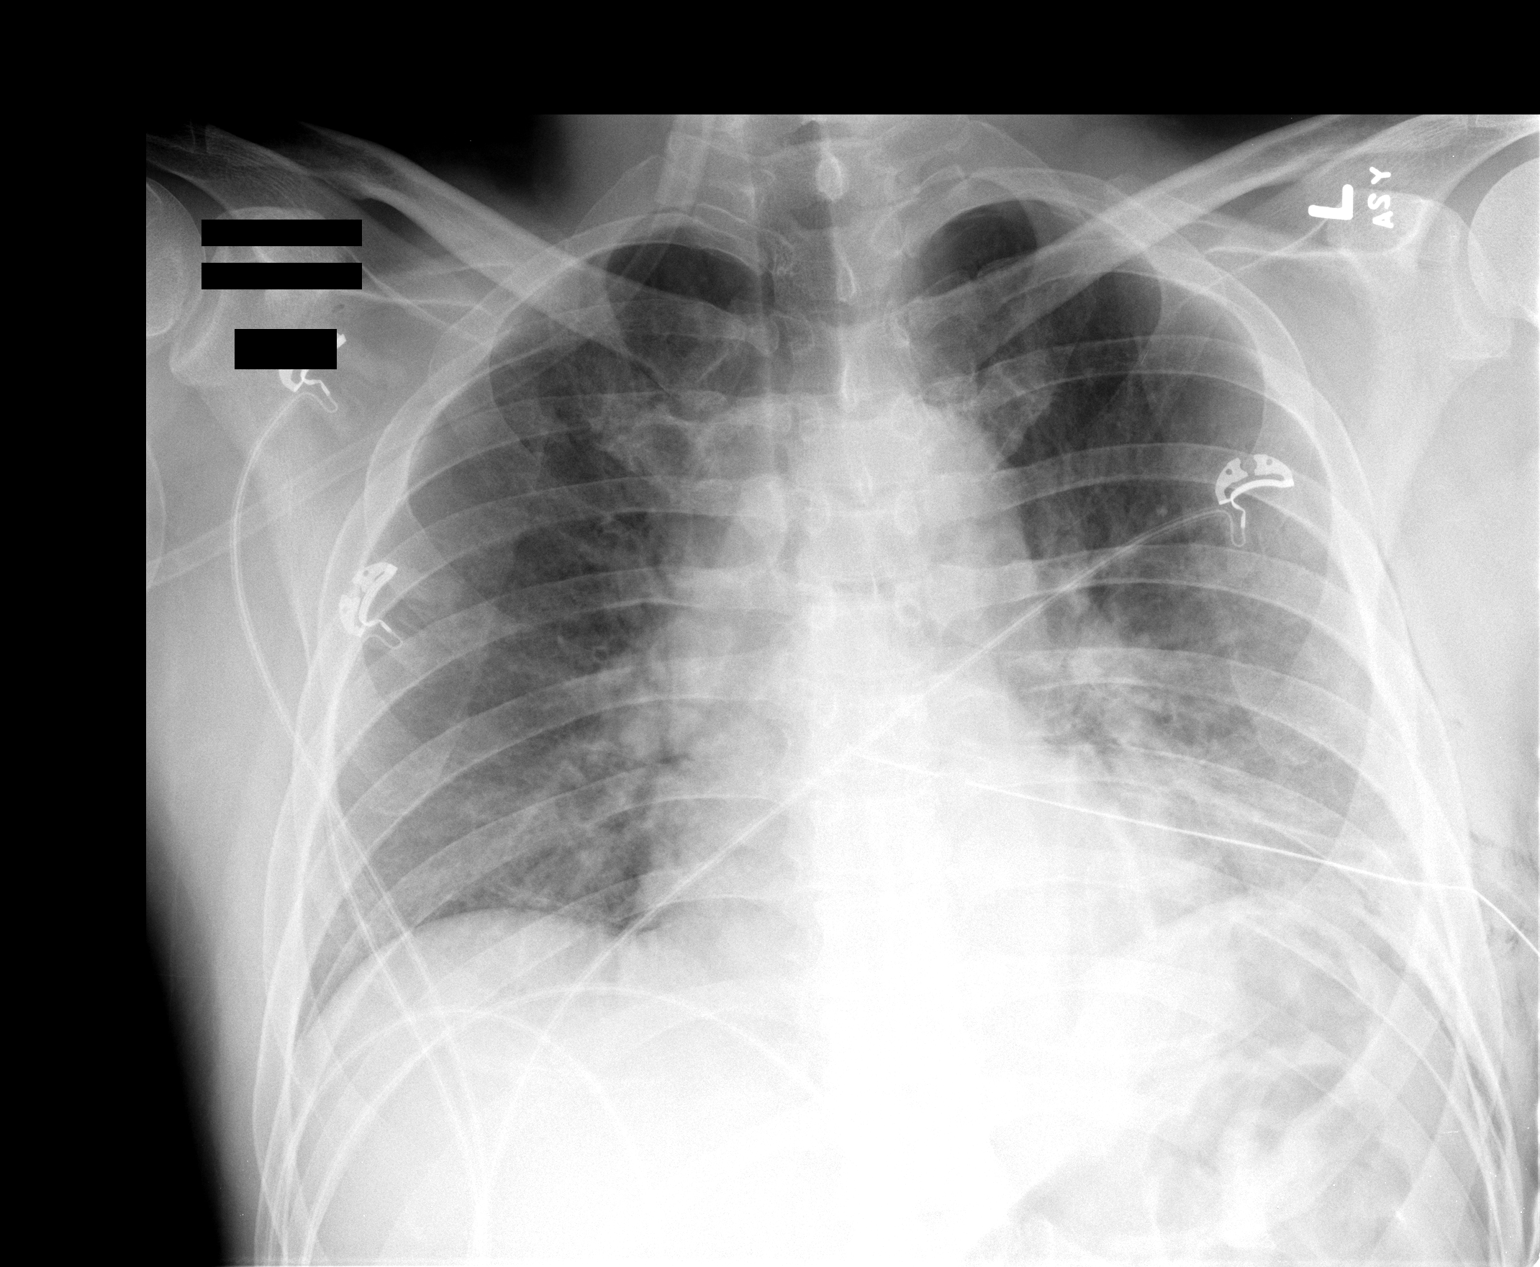

[1 of 1 positions shown; findings below may reference images not displayed]

FINDINGS: 0643 hours.  Left chest tube has been placed in the
interval, tip overlying the T7 vertebral body.  There has been
interval near complete evacuation of the left-sided pneumothorax
with a minimal apical component remaining.  There is soft tissue
emphysema within the chest wall and mild bibasilar atelectasis.
The heart size is stable.
IMPRESSION: Near complete evacuation of left-sided pneumothorax following chest
tube placement.  Bibasilar atelectasis.

## 2012-06-06 ENCOUNTER — Other Ambulatory Visit: Payer: Self-pay

## 2012-07-16 ENCOUNTER — Other Ambulatory Visit: Payer: Self-pay | Admitting: Emergency Medicine

## 2012-08-21 ENCOUNTER — Other Ambulatory Visit: Payer: Self-pay | Admitting: Emergency Medicine

## 2012-08-21 NOTE — Telephone Encounter (Signed)
Forward to Dr. Daub 

## 2012-08-22 NOTE — Telephone Encounter (Signed)
I did approve his medicine 1 refill. He does need to see me to come in and have levels checked of his testosterone and PSA.

## 2012-08-24 ENCOUNTER — Telehealth: Payer: Self-pay

## 2012-08-24 DIAGNOSIS — E781 Pure hyperglyceridemia: Secondary | ICD-10-CM

## 2012-08-24 DIAGNOSIS — E291 Testicular hypofunction: Secondary | ICD-10-CM

## 2012-08-24 DIAGNOSIS — Z79899 Other long term (current) drug therapy: Secondary | ICD-10-CM

## 2012-08-24 NOTE — Telephone Encounter (Signed)
Notified pt RF was sent but he is due for f/up and labs. Pt agreed and I transferred him to 104 for appt.

## 2012-08-24 NOTE — Telephone Encounter (Signed)
Pt Called back after setting up appt to see Dr Cleta Alberts. He was not able to get an appt until Sept. Dr Cleta Alberts, you wanted to check his testosterone and PSA in order to RF his Axiron again. Do you want to put in future order to have pt come in for labs only, or does he need to walk in to see you before his Sept appt?

## 2012-08-25 NOTE — Telephone Encounter (Signed)
Yes OK to add lipid panel

## 2012-08-25 NOTE — Telephone Encounter (Signed)
Notified pt that we have put orders in for labs. Pt asked if he can go ahead and get his cholesterol checked at the same time?

## 2012-08-25 NOTE — Telephone Encounter (Signed)
He can have an orders only encounter for total testosterone, free testosterone, and PSA

## 2012-08-26 NOTE — Telephone Encounter (Signed)
Added lipid panel to future orders and notified pt on VM. Advised him to come in fasting for labs.

## 2012-09-17 ENCOUNTER — Ambulatory Visit (INDEPENDENT_AMBULATORY_CARE_PROVIDER_SITE_OTHER): Payer: 59 | Admitting: Family Medicine

## 2012-09-17 VITALS — BP 134/89 | HR 73 | Temp 98.1°F | Resp 18 | Ht 68.0 in | Wt 202.0 lb

## 2012-09-17 DIAGNOSIS — N453 Epididymo-orchitis: Secondary | ICD-10-CM

## 2012-09-17 DIAGNOSIS — N451 Epididymitis: Secondary | ICD-10-CM

## 2012-09-17 MED ORDER — DICLOFENAC SODIUM 75 MG PO TBEC: 75.0000 mg | DELAYED_RELEASE_TABLET | Freq: Two times a day (BID) | ORAL | Status: DC

## 2012-09-17 MED ORDER — DOXYCYCLINE HYCLATE 100 MG PO CAPS: 100.0000 mg | ORAL_CAPSULE | Freq: Two times a day (BID) | ORAL | Status: AC

## 2012-09-17 NOTE — Progress Notes (Signed)
Subjective: 52 year old man who had a vasectomy 10 or 15 years ago. After that he had several epididymitis dermatitis flares. He had done well for an extended period of time. The last 2 days he has had sudden acute pain in the left scrotum just above his testicle with swelling and tenderness. He was planning to go out of town today to help a son in [place] move, but I discouraged that.  Objective: No hernia appreciated. Very tender superior aspect of the left testicle. Right seems normal. He is swollen along the epididymis.  Assessment: Acute epididymitis  Plan: Diclofenac twice daily Doxycycline twice daily

## 2012-09-17 NOTE — Patient Instructions (Signed)
Take the diclofenac one twice daily for pain and inflammation  Take the doxycycline one twice daily for infection. Take it with food. Be cautious that many people will sunburn it easier plan on doxycycline.  Use an ice pack on your groin several times a day for 10 or 15 minutes and it may give you some relief but helping reduce the swelling.  Return if worsen at time.

## 2012-11-03 ENCOUNTER — Other Ambulatory Visit: Payer: Self-pay | Admitting: Emergency Medicine

## 2012-12-08 ENCOUNTER — Other Ambulatory Visit: Payer: Self-pay | Admitting: Physician Assistant

## 2013-01-12 ENCOUNTER — Encounter: Payer: Self-pay | Admitting: Emergency Medicine

## 2013-01-19 ENCOUNTER — Encounter: Payer: Self-pay | Admitting: Emergency Medicine

## 2013-01-19 ENCOUNTER — Telehealth: Payer: Self-pay | Admitting: Family Medicine

## 2013-01-19 ENCOUNTER — Ambulatory Visit (INDEPENDENT_AMBULATORY_CARE_PROVIDER_SITE_OTHER): Payer: 59 | Admitting: Emergency Medicine

## 2013-01-19 ENCOUNTER — Other Ambulatory Visit: Payer: Self-pay | Admitting: Emergency Medicine

## 2013-01-19 VITALS — BP 132/80 | HR 66 | Temp 98.4°F | Resp 16 | Ht 68.5 in | Wt 195.4 lb

## 2013-01-19 DIAGNOSIS — J679 Hypersensitivity pneumonitis due to unspecified organic dust: Secondary | ICD-10-CM

## 2013-01-19 DIAGNOSIS — E781 Pure hyperglyceridemia: Secondary | ICD-10-CM

## 2013-01-19 DIAGNOSIS — Z0000 Encounter for general adult medical examination without abnormal findings: Secondary | ICD-10-CM

## 2013-01-19 DIAGNOSIS — E291 Testicular hypofunction: Secondary | ICD-10-CM

## 2013-01-19 DIAGNOSIS — J189 Pneumonia, unspecified organism: Secondary | ICD-10-CM

## 2013-01-19 DIAGNOSIS — Z79899 Other long term (current) drug therapy: Secondary | ICD-10-CM

## 2013-01-19 DIAGNOSIS — J984 Other disorders of lung: Secondary | ICD-10-CM

## 2013-01-19 LAB — LIPID PANEL
HDL: 40 mg/dL (ref >39)
LDL Cholesterol: 72 mg/dL (ref 0–99)
Total CHOL/HDL Ratio: 3.5 Ratio
Triglycerides: 137 mg/dL (ref <150)

## 2013-01-19 LAB — CBC WITH DIFFERENTIAL/PLATELET
Basophils Absolute: 0 10*3/uL (ref 0.0–0.1)
Basophils Relative: 1 % (ref 0–1)
Eosinophils Relative: 5 % (ref 0–5)
HCT: 42.5 % (ref 39.0–52.0)
MCHC: 34.8 g/dL (ref 30.0–36.0)
MCV: 83.8 fL (ref 78.0–100.0)
Monocytes Absolute: 0.4 10*3/uL (ref 0.1–1.0)
Platelets: 167 10*3/uL (ref 150–400)
RDW: 13.5 % (ref 11.5–15.5)

## 2013-01-19 LAB — POCT URINALYSIS DIPSTICK
Bilirubin, UA: NEGATIVE
Ketones, UA: NEGATIVE
Leukocytes, UA: NEGATIVE
Protein, UA: NEGATIVE
Spec Grav, UA: 1.01
pH, UA: 7

## 2013-01-19 LAB — COMPREHENSIVE METABOLIC PANEL
AST: 46 U/L — ABNORMAL HIGH (ref 0–37)
Alkaline Phosphatase: 42 U/L (ref 39–117)
BUN: 22 mg/dL (ref 6–23)
Creat: 1.01 mg/dL (ref 0.50–1.35)
Total Bilirubin: 0.9 mg/dL (ref 0.3–1.2)

## 2013-01-19 MED ORDER — TESTOSTERONE 30 MG/ACT TD SOLN: 60.0000 mg/d | Freq: Every morning | TRANSDERMAL | Status: DC

## 2013-01-19 MED ORDER — ATORVASTATIN CALCIUM 40 MG PO TABS: 40.0000 mg | ORAL_TABLET | Freq: Every day | ORAL | Status: DC

## 2013-01-19 NOTE — Telephone Encounter (Signed)
Faxed testosterone Rx to pharmacy.

## 2013-01-19 NOTE — Progress Notes (Signed)
  Subjective:    Patient ID: Adam Chang, male    DOB: July 23, 1960, 52 y.o.   MRN: [id number]  HPI    Review of Systems  Constitutional: Negative.   HENT: Negative.   Eyes: Negative.   Respiratory: Negative.   Cardiovascular: Negative.   Gastrointestinal: Negative.   Endocrine: Negative.   Genitourinary: Negative.   Musculoskeletal: Positive for myalgias and back pain.  Skin: Negative.   Allergic/Immunologic: Negative.   Neurological: Negative.   Hematological: Negative.   Psychiatric/Behavioral: Negative.        Objective:   Physical Exam HEENT exam is unremarkable. Neck is supple. Chest is clear to both auscultation and percussion. Heart regular rate no murmurs or gallops appreciated abdomen is soft liver and spleen not enlarged and no areas of tenderness no masses palpable extremities without cyanosis clubbing or edema GU is that of a normal male testicles descended no hernia felt rectal reveals a normal prostate stool brown        Assessment & Plan:  He is going to receive his flu shot at work. He is up-to-date on colonoscopy. I have refilled his testosterone prescription . He was given a shingles vaccine. He will let me know if he continues to have trouble with his back will consider acupuncture at that time. He is not interested in surgical evaluation .

## 2013-01-20 LAB — TESTOSTERONE, FREE, TOTAL, SHBG
Sex Hormone Binding: 24 nmol/L (ref 13–71)
Testosterone, Free: 44.5 pg/mL — ABNORMAL LOW (ref 47.0–244.0)
Testosterone: 197 ng/dL — ABNORMAL LOW (ref 300–890)

## 2013-01-22 ENCOUNTER — Other Ambulatory Visit: Payer: Self-pay | Admitting: Radiology

## 2013-01-22 DIAGNOSIS — R748 Abnormal levels of other serum enzymes: Secondary | ICD-10-CM

## 2013-02-02 ENCOUNTER — Encounter: Payer: Self-pay | Admitting: Emergency Medicine

## 2013-02-09 ENCOUNTER — Encounter: Payer: Self-pay | Admitting: Emergency Medicine

## 2013-02-15 ENCOUNTER — Telehealth: Payer: Self-pay | Admitting: Radiology

## 2013-02-15 ENCOUNTER — Other Ambulatory Visit (INDEPENDENT_AMBULATORY_CARE_PROVIDER_SITE_OTHER): Payer: 59 | Admitting: Radiology

## 2013-02-15 DIAGNOSIS — R748 Abnormal levels of other serum enzymes: Secondary | ICD-10-CM

## 2013-02-15 DIAGNOSIS — R7989 Other specified abnormal findings of blood chemistry: Secondary | ICD-10-CM

## 2013-02-15 NOTE — Telephone Encounter (Signed)
If you would like I can make him an appointment to see the endocrinologist Dr. Everardo All and he has alternative treatment programs for patients who do not tolerate the testosterone replacement.

## 2013-02-15 NOTE — Progress Notes (Signed)
labwork only; blood drawn for CK

## 2013-02-15 NOTE — Telephone Encounter (Signed)
Patient came in for his lab only. CK drawn, per our phone conversation. He indicates also his shoulders feel better, he was previously concerned about them and wanted labs for arthritis. He indicates he d/c his Testosterone and his shoulders are better. He wants to know if there is anything else he needs to be doing to for his low testosterone.

## 2013-02-15 NOTE — Telephone Encounter (Signed)
Called him to advise. He agrees to referral, this is made for him.

## 2013-02-17 ENCOUNTER — Encounter: Payer: Self-pay | Admitting: Internal Medicine

## 2013-02-23 ENCOUNTER — Encounter: Payer: Self-pay | Admitting: Endocrinology

## 2013-02-23 ENCOUNTER — Ambulatory Visit (INDEPENDENT_AMBULATORY_CARE_PROVIDER_SITE_OTHER): Payer: 59 | Admitting: Endocrinology

## 2013-02-23 VITALS — BP 126/78 | HR 76 | Ht 69.0 in | Wt 195.0 lb

## 2013-02-23 DIAGNOSIS — E291 Testicular hypofunction: Secondary | ICD-10-CM

## 2013-02-23 NOTE — Patient Instructions (Signed)
blood tests are being requested for you today.  We'll contact you with results. Based on the results, our plan will be to take the "clomiphine." Please come back for a follow-up appointment in 4-6 weeks.

## 2013-02-23 NOTE — Progress Notes (Signed)
Subjective:    Patient ID: Adam Chang, male    DOB: [birth year], 52 y.o.   MRN: [id number]  HPI Pt reports he had puberty at the normal age.  He has 3 biological children.  He has no h/o infertility.  He has never taken illicit androgens.  He did not take prescribed androgens until dr daub rx'ed.  He has no h/o head or genital injury, or surgery to either.  He has had a vasectomy.   In mid-2013, pt presented with emotional lability.  He was noted to have low testosterone.  He was rx'ed axiron.  Theses sxs resolved.  However, he developed moderate pain at the shoulders, but no assoc numbness.  He stopped the axiron 2 weeks ago, and pain resolved.   Past Medical History  Diagnosis Date  . Hypertriglyceridemia   . Seasonal allergies   . Hypoxemia   . Alveolar/parietoalveolar pneumonopathy, other   . 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  History   Social History  . Marital Status: Married    Spouse Name: Pam    Number of Children: N/A  . Years of Education: college   Occupational History  . electrician   . TECHNICIAN    Social History Main Topics  . Smoking status: Former Smoker -- 0.50 packs/day for 8 years    Types: Cigarettes    Quit date: 04/22/1985  . Smokeless tobacco: Never Used  . Alcohol Use: 1.2 oz/week    2 Shots of liquor per week  . Drug Use: No  . Sexual Activity: Yes    Birth Control/ Protection: None   Other Topics Concern  . Not on file   Social History Narrative   Denies any TB exposure.  Denies having any mold     in the house.  Denies having birds.  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  He denies asbestos exposure.  Denies any     nitrofurantoin or amiodarone intake.       Exercises. Amount varies.    Current Outpatient Prescriptions on File Prior to Visit  Medication Sig Dispense Refill  . azelastine (ASTELIN) 137 MCG/SPRAY nasal spray Use in each nostril as directed. Use 1-2 sprays in each nostril twice a day  30 mL  12  . diclofenac (VOLTAREN) 75 MG EC tablet Take 1 tablet (75 mg total) by mouth 2 (two) times daily.  30 tablet  0  . levocetirizine (XYZAL) 5 MG tablet Take 5 mg by mouth every evening.      . montelukast (SINGULAIR) 10 MG tablet Take 10 mg by mouth at bedtime.      . Multiple Vitamins-Minerals (MULTIVITAMIN WITH MINERALS) tablet Take 1 tablet by mouth daily.        Marland Kitchen atorvastatin (LIPITOR) 40 MG tablet Take 1 tablet (40 mg total) by mouth daily.  30 tablet  11  . Testosterone (AXIRON) 30 MG/ACT SOLN Apply 60 mg/day topically every morning.  90 mL  5   No current facility-administered medications on file prior to visit.   No Known Allergies  Family History  Problem Relation Age of Onset  . Stroke Father 70   BP  126/78  Pulse 76  Ht 5\' 9"  (1.753 m)  Wt 195 lb (88.451 kg)  BMI 28.78 kg/m2  SpO2 98%  Review of Systems denies depression, seizure, erectile dysfunction, weight change, decreased urinary stream, gynecomastia, muscle weakness, fever, headache, easy bruising, sob, rash, blurry vision, rhinorrhea, chest pain.      Objective:   Physical Exam VS: see vs page GEN: no distress HEAD: head: no deformity eyes: no periorbital swelling, no proptosis external nose and ears are normal mouth: no lesion seen NECK: supple, thyroid is not enlarged CHEST WALL: no deformity LUNGS: clear to auscultation BREASTS:  No gynecomastia CV: reg rate and rhythm, no murmur ABD: abdomen is soft, nontender.  no hepatosplenomegaly.  not distended.  no hernia GENITALIA:  Normal male.   MUSCULOSKELETAL: muscle bulk and strength are grossly normal.  no obvious joint swelling.  gait is normal and steady EXTEMITIES: no deformity.  no ulcer on the  feet.  feet are of normal color and temp.  no edema PULSES: dorsalis pedis intact bilat.  no carotid bruit NEURO:  cn 2-12 grossly intact.   readily moves all 4's.  sensation is intact to touch on the feet SKIN:  Normal texture and temperature.  No rash or suspicious lesion is visible.  Normal hair distribution.  No acne NODES:  None palpable at the neck PSYCH: alert, oriented x3.  Does not appear anxious nor depressed.    Lab Results  Component Value Date   TESTOSTERONE 197* 01/19/2013      Assessment & Plan:  Hypogonadism, uncertain etiology. emotional lability: uncertain if this is related to hypogonadism. Shoulder pain: unlikely related to axiron rx.

## 2013-02-24 LAB — PROLACTIN: Prolactin: 4.2 ng/mL (ref 2.1–17.1)

## 2013-02-24 LAB — FOLLICLE STIMULATING HORMONE: FSH: 5.6 m[IU]/mL (ref 1.4–18.1)

## 2013-02-24 MED ORDER — CLOMIPHENE CITRATE 50 MG PO TABS: ORAL_TABLET | ORAL | Status: DC

## 2013-02-25 ENCOUNTER — Other Ambulatory Visit: Payer: Self-pay

## 2013-03-10 ENCOUNTER — Encounter: Payer: Self-pay | Admitting: Emergency Medicine

## 2013-03-10 ENCOUNTER — Encounter: Payer: Self-pay | Admitting: Endocrinology

## 2013-04-23 NOTE — Telephone Encounter (Signed)
No other info °

## 2013-05-17 ENCOUNTER — Encounter: Payer: Self-pay | Admitting: Endocrinology

## 2013-05-29 ENCOUNTER — Ambulatory Visit (INDEPENDENT_AMBULATORY_CARE_PROVIDER_SITE_OTHER): Payer: 59 | Admitting: Family Medicine

## 2013-05-29 VITALS — BP 132/92 | HR 71 | Temp 98.2°F | Resp 18 | Ht 69.0 in | Wt 198.0 lb

## 2013-05-29 DIAGNOSIS — S058X9A Other injuries of unspecified eye and orbit, initial encounter: Secondary | ICD-10-CM

## 2013-05-29 DIAGNOSIS — J069 Acute upper respiratory infection, unspecified: Secondary | ICD-10-CM

## 2013-05-29 DIAGNOSIS — H5710 Ocular pain, unspecified eye: Secondary | ICD-10-CM

## 2013-05-29 DIAGNOSIS — J0100 Acute maxillary sinusitis, unspecified: Secondary | ICD-10-CM

## 2013-05-29 DIAGNOSIS — S0501XA Injury of conjunctiva and corneal abrasion without foreign body, right eye, initial encounter: Secondary | ICD-10-CM

## 2013-05-29 DIAGNOSIS — H5711 Ocular pain, right eye: Secondary | ICD-10-CM

## 2013-05-29 MED ORDER — AMOXICILLIN 500 MG PO CAPS: 1000.0000 mg | ORAL_CAPSULE | Freq: Two times a day (BID) | ORAL | Status: DC

## 2013-05-29 MED ORDER — OFLOXACIN 0.3 % OP SOLN: 2.0000 [drp] | OPHTHALMIC | Status: DC

## 2013-05-29 NOTE — Progress Notes (Signed)
Subjective:   This chart was scribed for Adam ChickKristi M Smith, MD by Adam OrganAshley Chang, [facility]. This patient was seen in room 5 and the patient's care was started 2:50 PM.   Patient ID: Adam Chang, male    DOB: 04/22/1961, 53 y.o.   MRN: [id number]  05/29/2013  Sore Throat; Cough; and Eye Pain   HPI HPI Comments: Adam Chang is a 53 y.o. male who presents to [facility] complaining of a gradually worsening, constant sore throat that started 5 days ago. He reports associated rhinorrhea consisting of clear mucous, cough, and postnasal drip. He states his sore throat is significantly worsened with his cough causing him pain described as "raw". Denies any alleviating factors at this time. Pt states he experiences increased fatigue and SOB with exertion. He states typically he is very active, but at this time, he is unable to go more than 10 minutes without feeling completely fatigued. He has tried aspirin with no noticeably improvement for any of his symptoms. He reports multiple sick contacts as many people at his job have been out of work sick with similar symptoms. At this time he denies chills, fever, diaphoresis, or generalized body aches. Denies any known allergies at this time.   He has also noted new blurred vision to the right eye he associates with "something being stuck" in his eye. He admits to recent trouble reading since onset of visual disturbance. He denies any photophobia at this time. He states he does yard work frequently, but is unable to specify something getting into his eye that he is able to remember.  Review of Systems  Constitutional: Negative for fever, chills, diaphoresis, activity change and appetite change.  HENT: Positive for postnasal drip, rhinorrhea and sore throat. Negative for ear pain.   Eyes: Positive for visual disturbance. Negative for redness.  Respiratory: Positive for cough.     Past Medical History    Diagnosis Date  . Hypertriglyceridemia   . Seasonal allergies   . Hypoxemia   . Alveolar/parietoalveolar pneumonopathy, other   . 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 Multiple Vitamins-Minerals (MULTIVITAMIN WITH MINERALS) tablet Take 1 tablet by mouth daily.      Marland Kitchen. azelastine (ASTELIN) 137 MCG/SPRAY nasal spray Use in each nostril as directed. Use 1-2 sprays in each nostril twice a day 30 mL 12  . clomiPHENE (CLOMID) 50 MG tablet TAKE 1/4 TABLET BY MOUTH DAILY 15 tablet 2  . clomiPHENE (CLOMID) 50 MG tablet TAKE 1/4 TABLET BY MOUTH EVERY DAY 15 tablet 0   No current facility-administered medications for this visit.    Objective:   BP 132/92  Pulse 71  Temp(Src) 98.2 F (36.8 C) (Oral)  Resp 18  Ht 5\' 9"  (1.753 m)  Wt 198 lb (89.812 kg)  BMI 29.23 kg/m2  SpO2 96%  Physical Exam  Constitutional: He is oriented to person, place, and time. He appears well-developed and well-nourished. No distress.  HENT:  Head: Normocephalic and atraumatic.  Right Ear: External ear normal.  Left Ear: External ear normal.  Nose: Mucosal edema and rhinorrhea present. Right sinus exhibits maxillary sinus tenderness and frontal sinus tenderness. Left sinus exhibits maxillary sinus tenderness and frontal sinus tenderness.  Mouth/Throat: Mucous membranes are normal. Oropharyngeal exudate present. No posterior oropharyngeal edema or posterior oropharyngeal erythema.  Eyes: EOM are normal. Pupils are equal, round, and reactive to light.  Right eye exhibits no discharge. Left eye exhibits no discharge. Right conjunctiva is injected. Right conjunctiva has no hemorrhage. Left conjunctiva is not injected. Left conjunctiva has no hemorrhage.  Slit lamp exam:      The right eye shows corneal abrasion and fluorescein uptake. The right eye shows no corneal ulcer and no hyphema.  Neck: Normal range of motion.   Cardiovascular: Normal rate, regular rhythm, normal heart sounds and intact distal pulses.  Exam reveals no gallop and no friction rub.   No murmur heard. Pulmonary/Chest: Effort normal and breath sounds normal. No respiratory distress. He has no wheezes. He has no rales. He exhibits no tenderness.  Musculoskeletal: Normal range of motion.  Neurological: He is alert and oriented to person, place, and time.  Skin: Skin is warm and dry. He is not diaphoretic.  Psychiatric: He has a normal mood and affect. His behavior is normal. Judgment and thought content normal.  Nursing note and vitals reviewed.   Assessment & Plan:  Acute maxillary sinusitis - Plan: DISCONTINUED: amoxicillin (AMOXIL) 500 MG capsule  Acute upper respiratory infections of unspecified site  Pain, eye, right  Corneal abrasion, right - Plan: DISCONTINUED: ofloxacin (OCUFLOX) 0.3 % ophthalmic solution   1. Acute maxillary sinusitis:  New. Rx for Amoxicillin provided; recommend OTC medications for congestion. 2.  Pain R eye: New. Secondary to corneal abrasion.  3.  R corneal abrasion: New.  Rx for Ocuflox provided; RTC 24 hours for reevaluation.   Meds ordered this encounter  Medications  . DISCONTD: amoxicillin (AMOXIL) 500 MG capsule    Sig: Take 2 capsules (1,000 mg total) by mouth 2 (two) times daily.    Dispense:  40 capsule    Refill:  0  . DISCONTD: ofloxacin (OCUFLOX) 0.3 % ophthalmic solution    Sig: Place 2 drops into the right eye every 4 (four) hours.    Dispense:  10 mL    Refill:  0    Return in about 1 day (around 05/30/2013) for CORNEAL ABRASION.    I personally performed the services described in this documentation, which was scribed in my presence.  The recorded information has been reviewed and is accurate.  Adam Chang, M.D.  [facility] [address] [place], [place]  [address] [phone] phone [phone] fax

## 2013-05-29 NOTE — Patient Instructions (Signed)
Corneal Abrasion  The cornea is the clear covering at the front and center of the eye. When looking at the colored portion of the eye (iris), you are looking through the cornea. This very thin tissue is made up of many layers. The surface layer is a single layer of cells (corneal epithelium) and is one of the most sensitive tissues in the body. If a scratch or injury causes the corneal epithelium to come off, it is called a corneal abrasion. If the injury extends to the tissues below the epithelium, the condition is called a corneal ulcer.  CAUSES    Scratches.   Trauma.   Foreign body in the eye.  Some people have recurrences of abrasions in the area of the original injury even after it has healed (recurrent erosion syndrome). Recurrent erosion syndrome generally improves and goes away with time.  SYMPTOMS    Eye pain.   Difficulty or inability to keep the injured eye open.   The eye becomes very sensitive to light.   Recurrent erosions tend to happen suddenly, first thing in the morning, usually after waking up and opening the eye.  DIAGNOSIS   Your health care provider can diagnose a corneal abrasion during an eye exam. Dye is usually placed in the eye using a drop or a small paper strip moistened by your tears. When the eye is examined with a special light, the abrasion shows up clearly because of the dye.  TREATMENT    Small abrasions may be treated with antibiotic drops or ointment alone.   Usually a pressure patch is specially applied. Pressure patches prevent the eye from blinking, allowing the corneal epithelium to heal. A pressure patch also reduces the amount of pain present in the eye during healing. Most corneal abrasions heal within 2 3 days with no effect on vision.  If the abrasion becomes infected and spreads to the deeper tissues of the cornea, a corneal ulcer can result. This is serious because it can cause corneal scarring. Corneal scars interfere with light passing through the cornea  and cause a loss of vision in the involved eye.  HOME CARE INSTRUCTIONS   Use medicine or ointment as directed. Only take over-the-counter or prescription medicines for pain, discomfort, or fever as directed by your health care provider.   Do not drive or operate machinery while your eye is patched. Your ability to judge distances is impaired.   If your health care provider has given you a follow-up appointment, it is very important to keep that appointment. Not keeping the appointment could result in a severe eye infection or permanent loss of vision. If there is any problem keeping the appointment, let your health care provider know.  SEEK MEDICAL CARE IF:    You have pain, light sensitivity, and a scratchy feeling in one eye or both eyes.   Your pressure patch keeps loosening up, and you can blink your eye under the patch after treatment.   Any kind of discharge develops from the eye after treatment or if the lids stick together in the morning.   You have the same symptoms in the morning as you did with the original abrasion days, weeks, or months after the abrasion healed.  MAKE SURE YOU:    Understand these instructions.   Will watch your condition.   Will get help right away if you are not doing well or get worse.  Document Released: 04/05/2000 Document Revised: 01/27/2013 Document Reviewed: 12/14/2012  [employer] Patient Information   2014 [employer]

## 2013-09-21 ENCOUNTER — Other Ambulatory Visit: Payer: Self-pay | Admitting: Endocrinology

## 2013-10-14 ENCOUNTER — Encounter: Payer: 59 | Admitting: Emergency Medicine

## 2013-11-11 ENCOUNTER — Ambulatory Visit (INDEPENDENT_AMBULATORY_CARE_PROVIDER_SITE_OTHER): Payer: 59

## 2013-11-11 ENCOUNTER — Ambulatory Visit (INDEPENDENT_AMBULATORY_CARE_PROVIDER_SITE_OTHER): Payer: 59 | Admitting: Emergency Medicine

## 2013-11-11 ENCOUNTER — Encounter: Payer: Self-pay | Admitting: Emergency Medicine

## 2013-11-11 VITALS — BP 123/77 | HR 74 | Temp 98.4°F | Resp 16 | Ht 69.0 in | Wt 195.2 lb

## 2013-11-11 DIAGNOSIS — Z0000 Encounter for general adult medical examination without abnormal findings: Secondary | ICD-10-CM

## 2013-11-11 DIAGNOSIS — M545 Low back pain, unspecified: Secondary | ICD-10-CM

## 2013-11-11 DIAGNOSIS — R9431 Abnormal electrocardiogram [ECG] [EKG]: Secondary | ICD-10-CM

## 2013-11-11 DIAGNOSIS — Z23 Encounter for immunization: Secondary | ICD-10-CM

## 2013-11-11 DIAGNOSIS — E291 Testicular hypofunction: Secondary | ICD-10-CM

## 2013-11-11 DIAGNOSIS — E781 Pure hyperglyceridemia: Secondary | ICD-10-CM

## 2013-11-11 LAB — COMPLETE METABOLIC PANEL WITH GFR
ALT: 30 U/L (ref 0–53)
AST: 25 U/L (ref 0–37)
Albumin: 4.6 g/dL (ref 3.5–5.2)
Alkaline Phosphatase: 34 U/L — ABNORMAL LOW (ref 39–117)
BUN: 21 mg/dL (ref 6–23)
CO2: 24 meq/L (ref 19–32)
Calcium: 9.6 mg/dL (ref 8.4–10.5)
Chloride: 104 mEq/L (ref 96–112)
Creat: 1.22 mg/dL (ref 0.50–1.35)
GFR, Est African American: 77 mL/min
GFR, Est Non African American: 66 mL/min
GLUCOSE: 83 mg/dL (ref 70–99)
POTASSIUM: 4.7 meq/L (ref 3.5–5.3)
Sodium: 140 mEq/L (ref 135–145)
Total Bilirubin: 0.8 mg/dL (ref 0.2–1.2)
Total Protein: 7 g/dL (ref 6.0–8.3)

## 2013-11-11 LAB — CBC WITH DIFFERENTIAL/PLATELET
Basophils Absolute: 0.1 10*3/uL (ref 0.0–0.1)
Basophils Relative: 1 % (ref 0–1)
Eosinophils Absolute: 0.3 10*3/uL (ref 0.0–0.7)
Eosinophils Relative: 5 % (ref 0–5)
HEMATOCRIT: 44.8 % (ref 39.0–52.0)
HEMOGLOBIN: 15.8 g/dL (ref 13.0–17.0)
Lymphocytes Relative: 26 % (ref 12–46)
Lymphs Abs: 1.4 10*3/uL (ref 0.7–4.0)
MCH: 30.2 pg (ref 26.0–34.0)
MCHC: 35.3 g/dL (ref 30.0–36.0)
MCV: 85.7 fL (ref 78.0–100.0)
MONO ABS: 0.6 10*3/uL (ref 0.1–1.0)
MONOS PCT: 11 % (ref 3–12)
NEUTROS ABS: 3.1 10*3/uL (ref 1.7–7.7)
Neutrophils Relative %: 57 % (ref 43–77)
Platelets: 158 10*3/uL (ref 150–400)
RBC: 5.23 MIL/uL (ref 4.22–5.81)
RDW: 14.6 % (ref 11.5–15.5)
WBC: 5.4 10*3/uL (ref 4.0–10.5)

## 2013-11-11 LAB — POCT URINALYSIS DIPSTICK
Bilirubin, UA: NEGATIVE
Blood, UA: NEGATIVE
Glucose, UA: NEGATIVE
KETONES UA: NEGATIVE
LEUKOCYTES UA: NEGATIVE
Nitrite, UA: NEGATIVE
PH UA: 7
PROTEIN UA: NEGATIVE
Spec Grav, UA: 1.015
UROBILINOGEN UA: 0.2

## 2013-11-11 LAB — IFOBT (OCCULT BLOOD): IMMUNOLOGICAL FECAL OCCULT BLOOD TEST: NEGATIVE

## 2013-11-11 LAB — TSH: TSH: 0.958 u[IU]/mL (ref 0.350–4.500)

## 2013-11-11 NOTE — Progress Notes (Addendum)
Subjective:    Patient ID: Janee Morn, male    DOB: 09-20-1960, 53 y.o.   MRN: [id number]  This chart was scribed for Collene Gobble, MD by Julian Hy, ED Scribe. The patient was seen in Room 22. The patient's care was started at 8:39 AM.   Chief Complaint  Patient presents with  . Annual Exam    Pt is fasting   Past Medical History  Diagnosis Date  . Hypertriglyceridemia   . Seasonal allergies   . Hypoxemia   . Alveolar/parietoalveolar pneumonopathy, other   . 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 azelastine (ASTELIN) 137 MCG/SPRAY nasal spray Use in each nostril as directed. Use 1-2 sprays in each nostril twice a day  30 mL  12  . clomiPHENE (CLOMID) 50 MG tablet TAKE 1/4 TABLET BY MOUTH DAILY  15 tablet  0  . Multiple Vitamins-Minerals (MULTIVITAMIN WITH MINERALS) tablet Take 1 tablet by mouth daily.        Marland Kitchen amoxicillin (AMOXIL) 500 MG capsule Take 2 capsules (1,000 mg total) by mouth 2 (two) times daily.  40 capsule  0  . atorvastatin (LIPITOR) 40 MG tablet Take 1 tablet (40 mg total) by mouth daily.  30 tablet  11  . diclofenac (VOLTAREN) 75 MG EC tablet Take 1 tablet (75 mg total) by mouth 2 (two) times daily.  30 tablet  0  . levocetirizine (XYZAL) 5 MG tablet Take 5 mg by mouth every evening.      . montelukast (SINGULAIR) 10 MG tablet Take 10 mg by mouth at bedtime.      Marland Kitchen ofloxacin (OCUFLOX) 0.3 % ophthalmic solution Place 2 drops into the right eye every 4 (four) hours.  10 mL  0  . Testosterone (AXIRON) 30 MG/ACT SOLN Apply 60 mg/day topically every morning.  90 mL  5   No current facility-administered medications on file prior to visit.    HPI HPI Comments: JIYAN WALKOWSKI is a 53 y.o. male who [facility] the [facility]. Pt states he has been feeling well lately. Pt reports his lungs are feeling well. Pt reports his oxygen  saturation was around 93% for a period of time, and he was having difficulty raising it.   Pt reports he received the shingles vaccine 2 months ago. Pt is unable to specify if he had the pneumonia vaccine. Pt reports he receives the flu vaccine annually. Tetanus is UTD. Pt denies nausea, vomiting, fever and chills.  Pt reports seeing Dr. Romero Belling and was prescribed Clomid 50 MG. Pt states that if he had to go to a pulmonary specialist he'd prefer to go to [facility].   Pt reports his father had a stroke at 5 and is no longer living. Pt denies having follow-up with Pulmonary specialist annually. Pt denies having a family history of cancer.    Review of Systems  Constitutional: Negative for fever and chills.  Respiratory: Negative for shortness of breath.   Gastrointestinal: Negative for nausea and vomiting.  Neurological: Negative for weakness.     Triage Vitals: BP 123/77  Pulse 74  Temp(Src) 98.4 F (36.9 C) (Oral)  Resp 16  Ht 5\' 9"  (1.753 m)  Wt 195 lb 3.2 oz (88.542 kg)  BMI 28.81 kg/m2  SpO2 96%  Objective:   Physical Exam CONSTITUTIONAL: Well developed/well nourished HEAD: Normocephalic/atraumatic EYES: EOMI/PERRL ENMT:  Mucous membranes moist NECK: supple no meningeal signs SPINE:entire spine nontender CV: S1/S2 noted, no murmurs/rubs/gallops noted LUNGS: Few dry crackles in both bases. Good air exchange.  ABDOMEN: soft, nontender, no rebound or guarding GU:no cva tenderness NEURO: Pt is awake/alert, moves all extremitiesx4 EXTREMITIES: pulses normal, full ROM SKIN: warm, color normal PSYCH: no abnormalities of mood noted    Results for orders placed in visit on 11/11/13  POCT URINALYSIS DIPSTICK      Result Value Ref Range   Color, UA yellow     Clarity, UA clear     Glucose, UA neg     Bilirubin, UA neg     Ketones, UA neg     Spec Grav, UA 1.015     Blood, UA neg     pH, UA 7.0     Protein, UA neg     Urobilinogen, UA 0.2     Nitrite, UA neg      Leukocytes, UA Negative    IFOBT (OCCULT BLOOD)      Result Value Ref Range   IFOBT Negative    [facility] reading (PRIMARY) by  Dr. Cleta Albertsaub the markings appear somewhat more prominent on the chest x-ray today compared to the film on 2013. Please comment Ekg there is a T wave down in 3 and aVF. This was present on EKG last year. On EKG done in 2013 there was only a T wave down in lead 3 Assessment & Plan:  DIAGNOSTIC STUDIES: Oxygen Saturation is 96% on RA, adequate by my interpretation.    COORDINATION OF CARE: 8:47 AM- Will order chest x-ray and EKG. Patient informed of current plan for treatment and evaluation and agrees with plan at this time. Chest x-ray to me shows some increased markings. He had significant pulmonary problems a few years ago. If he needs referral again it will [facility]. He also is having significant back discomfort referral made to hold how her in physical therapy for help. When he took lipids in the past he had difficulty with elevation of his muscle enzymes. We will see what his levels are and if he does have to start a statin will need followup labs in about 2 weeks to be sure it is not causing any muscle issues. He is [age] with risk factors of heart disease I do think a cardiology referral is appropriate. He has seen Dr. Milas KocherBensimon in the past.

## 2013-11-11 NOTE — Addendum Note (Signed)
Addended by: Lesle ChrisAUB, Sheniah Supak A on: 11/11/2013 11:47 AM   Modules accepted: Orders

## 2013-11-12 LAB — TESTOSTERONE, FREE, TOTAL, SHBG
Sex Hormone Binding: 29 nmol/L (ref 13–71)
TESTOSTERONE-% FREE: 2.3 % (ref 1.6–2.9)
TESTOSTERONE: 525 ng/dL (ref 300–890)
Testosterone, Free: 119.4 pg/mL (ref 47.0–244.0)

## 2013-11-12 LAB — PSA: PSA: 0.34 ng/mL (ref <=4.00)

## 2013-11-14 LAB — LIPID PANEL
CHOL/HDL RATIO: 6.2 ratio
Cholesterol: 192 mg/dL (ref 0–200)
HDL: 31 mg/dL — ABNORMAL LOW (ref >39)
Triglycerides: 505 mg/dL — ABNORMAL HIGH (ref <150)

## 2013-11-22 ENCOUNTER — Encounter: Payer: Self-pay | Admitting: Emergency Medicine

## 2013-11-22 ENCOUNTER — Encounter: Payer: Self-pay | Admitting: Endocrinology

## 2013-11-24 ENCOUNTER — Ambulatory Visit (INDEPENDENT_AMBULATORY_CARE_PROVIDER_SITE_OTHER): Payer: 59 | Admitting: Cardiology

## 2013-11-24 ENCOUNTER — Encounter: Payer: Self-pay | Admitting: Cardiology

## 2013-11-24 VITALS — BP 118/74 | HR 66 | Ht 69.0 in | Wt 195.0 lb

## 2013-11-24 DIAGNOSIS — R9431 Abnormal electrocardiogram [ECG] [EKG]: Secondary | ICD-10-CM

## 2013-11-24 DIAGNOSIS — E781 Pure hyperglyceridemia: Secondary | ICD-10-CM

## 2013-11-24 DIAGNOSIS — R0902 Hypoxemia: Secondary | ICD-10-CM

## 2013-11-24 NOTE — Patient Instructions (Signed)
The current medical regimen is effective;  continue present plan and medications.  Your physician has requested that you have an echocardiogram. Echocardiography is a painless test that uses sound waves to create images of your heart. It provides your doctor with information about the size and shape of your heart and how well your heart's chambers and valves are working. This procedure takes approximately one hour. There are no restrictions for this procedure.  Follow up as needed. 

## 2013-11-24 NOTE — Progress Notes (Signed)
[address] [place], [place]  [address] Phone: [phone] Fax:  [phone]  Date:  11/24/2013   ID:  KOLTIN WEHMEYER, DOB [birth year], MRN [id number]  PCP:  Lucilla Edin, MD   History of Present Illness: Adam Chang is a 53 y.o. male here for the evaluation of abnormal EKG with T-wave inversion in 3 and aVL. Prior cardiac catheterization reviewed from 07/20/10, right and left which was unremarkable. Normal coronary arteries. Reassuring right-sided heart pressures. No significant step up. This was done after prolonged dyspnea/hypoxia with sputum production. He ended up having bronchoscopy which resulted in pneumothorax he states. Ended up seeing a pulmonary doctor at [facility]. Prednisone. His symptoms may have been secondary to humidifier he states. He had tear out  the [place] bathtub he had installed.  He had remembered that Dr. Gala Romney had a resting heart rate in the 40s. He asked me how he did this.  He denies any chest pain. He has worked significantly on his hypertriglyceridemia.   Wt Readings from Last 3 Encounters:  11/24/13 195 lb (88.451 kg)  11/11/13 195 lb 3.2 oz (88.542 kg)  05/29/13 198 lb (89.812 kg)     Past Medical History  Diagnosis Date  . Hypertriglyceridemia   . Seasonal allergies   . Hypoxemia   . Alveolar/parietoalveolar pneumonopathy, other   . 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  Current Outpatient Prescriptions  Medication Sig Dispense Refill  . azelastine (ASTELIN) 137 MCG/SPRAY nasal spray Use in each nostril as directed. Use 1-2 sprays in each nostril twice a day  30 mL  12  . clomiPHENE (CLOMID) 50 MG tablet TAKE 1/4 TABLET BY MOUTH DAILY  15 tablet  0  . Multiple Vitamins-Minerals (MULTIVITAMIN WITH MINERALS) tablet Take 1 tablet by mouth daily.         No current facility-administered medications for this visit.    Allergies:   No Known Allergies  Social  History:  The patient  reports that he quit smoking about 28 years ago. His smoking use included Cigarettes. He has a 4 pack-year smoking history. He has never used smokeless tobacco. He reports that he drinks about 1.2 ounces of alcohol per week. He reports that he does not use illicit drugs.   Family History  Problem Relation Age of Onset  . Stroke Father 62    ROS:  Please see the history of present illness.   No chest pain, no syncope, no bleeding, no orthopnea. Chronic dyspnea on exertion. No changes. Positive hypertriglyceridemia.   All other systems reviewed and negative.   PHYSICAL EXAM: VS:  BP 118/74  Pulse 66  Ht 5\' 9"  (1.753 m)  Wt 195 lb (88.451 kg)  BMI 28.78 kg/m2 Well nourished, well developed, in no acute distress HEENT: normal, [place]/AT, EOMI Neck: no JVD, normal carotid upstroke, no bruit Cardiac:  normal S1, S2; RRR; no murmur Lungs:  clear to auscultation bilaterally, no wheezing, rhonchi or rales Abd: soft, nontender, no hepatomegaly, no bruits Ext: no edema, 2+ distal pulses Skin: warm and dry GU: deferred Neuro: no focal abnormalities noted, AAO x 3  EKG:  11/11/13-sinus rhythm, nonspecific ST-T wave changes, T wave inversion 3, subtle aVF.     ASSESSMENT AND PLAN:  1. Abnormal EKG-we will check an echocardiogram. Prior cardiac catheterization reviewed and showed normal cardiac function, normal coronary arteries. Normal right-sided pressures. No evidence of significant step up. Currently not  having any chest pain. 2. Hypoxia-previously [facility] very [facility] as well as with right and left heart catheterization. Still feels somewhat short of breath at baseline with exertional activity. 3. Hypertriglyceridemia-he has been on several different medications in the past. He was on one medication he remembers that he was asked to stop because of some abnormal lab values perhaps "muscle damage ". In review of his lab work, on  01/19/13 his CK total was 766, repeat in one month later was 295. From review of records, and appears that he was on atorvastatin at that time. Fish oil can reduce triglycerides and taken at 4 g a day by up to 50%. Last triglycerides were 505. Trigs greater than 500 on a consistent basis can result in pancreatitis. He will continue to work with Dr. Cleta Albertsaub with this. Currently aggressive exercise, dietary modifications. Consider fish oil. At this point, avoid statin therapy. 4. We will see back on as-needed basis. I will followup with echocardiogram.  Signed, Donato SchultzMark Skains, MD [facility]  11/24/2013 3:35 PM

## 2013-12-01 ENCOUNTER — Ambulatory Visit (HOSPITAL_COMMUNITY): Payer: 59 | Attending: Cardiology | Admitting: Cardiology

## 2013-12-01 DIAGNOSIS — I517 Cardiomegaly: Secondary | ICD-10-CM | POA: Insufficient documentation

## 2013-12-01 DIAGNOSIS — I079 Rheumatic tricuspid valve disease, unspecified: Secondary | ICD-10-CM | POA: Insufficient documentation

## 2013-12-01 DIAGNOSIS — R9431 Abnormal electrocardiogram [ECG] [EKG]: Secondary | ICD-10-CM | POA: Diagnosis present

## 2013-12-01 DIAGNOSIS — I379 Nonrheumatic pulmonary valve disorder, unspecified: Secondary | ICD-10-CM | POA: Insufficient documentation

## 2013-12-01 NOTE — Progress Notes (Signed)
Echo performed. 

## 2013-12-07 ENCOUNTER — Other Ambulatory Visit: Payer: Self-pay

## 2013-12-07 ENCOUNTER — Encounter: Payer: Self-pay | Admitting: Endocrinology

## 2013-12-07 MED ORDER — CLOMIPHENE CITRATE 50 MG PO TABS: ORAL_TABLET | ORAL | Status: DC

## 2013-12-15 ENCOUNTER — Other Ambulatory Visit: Payer: Self-pay

## 2013-12-15 ENCOUNTER — Encounter: Payer: Self-pay | Admitting: Endocrinology

## 2013-12-15 ENCOUNTER — Ambulatory Visit (INDEPENDENT_AMBULATORY_CARE_PROVIDER_SITE_OTHER): Payer: 59 | Admitting: Endocrinology

## 2013-12-15 VITALS — BP 112/64 | HR 80 | Temp 98.0°F | Ht 69.0 in | Wt 192.0 lb

## 2013-12-15 DIAGNOSIS — E291 Testicular hypofunction: Secondary | ICD-10-CM

## 2013-12-15 MED ORDER — CLOMIPHENE CITRATE 50 MG PO TABS: ORAL_TABLET | ORAL | Status: DC

## 2013-12-15 NOTE — Patient Instructions (Addendum)
Please continue the same medication. normalization of testosterone is not known to harm you.  however, there are "theoretical" risks, including increased fertility, hair loss, prostate cancer, benign prostate enlargement, blood clots, liver problems, lower hdl ("good cholesterol"), sleep apnea, and behavior changes.   Please come back for a follow-up appointment in 1 year

## 2013-12-15 NOTE — Progress Notes (Signed)
Subjective:    Patient ID: Adam Chang, male    DOB: Dec 28, 1960, 53 y.o.   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  He has no h/o infertility.  He has never taken illicit androgens; he has had a vasectomy; in mid-2013, pt presented with emotional lability; he took axiron for a few weeks, but did better on clomid).  Denies muscle weakness.  Denies decreased urinary stream.   Past Medical History  Diagnosis Date  . Hypertriglyceridemia   . Seasonal allergies   . Hypoxemia   . Alveolar/parietoalveolar pneumonopathy, other   . 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  History   Social History  . Marital Status: Married    Spouse Name: Pam    Number of Children: N/A  . Years of Education: college   Occupational History  . electrician   . TECHNICIAN    Social History Main Topics  . Smoking status: Former Smoker -- 0.50 packs/day for 8 years    Types: Cigarettes    Quit date: 04/22/1985  . Smokeless tobacco: Never Used  . Alcohol Use: 1.2 oz/week    2 Shots of liquor per week  . Drug Use: No  . Sexual Activity: Yes    Birth Control/ Protection: None   Other Topics Concern  . Not on file   Social History Narrative   Denies any TB exposure.  Denies having any mold     in the house.  Denies having birds.  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  He denies asbestos exposure.  Denies any     nitrofurantoin or amiodarone intake.       Exercises. Amount varies.    Current Outpatient Prescriptions on File Prior to Visit  Medication Sig Dispense Refill  . azelastine (ASTELIN) 137 MCG/SPRAY nasal spray Use in each nostril as directed.  Use 1-2 sprays in each nostril twice a day  30 mL  12  . Multiple Vitamins-Minerals (MULTIVITAMIN WITH MINERALS) tablet Take 1 tablet by mouth daily.         No current facility-administered medications on file prior to visit.    No Known Allergies  Family History  Problem Relation Age of Onset  . Stroke Father 70    BP 112/64  Pulse 80  Temp(Src) 98 F (36.7 C) (Oral)  Ht  (1.753 m)  Wt 192 lb (87.091 kg)  BMI 28.34 kg/m2  SpO2 95%   Review of Systems Denies ED symptoms.      Objective:   Physical Exam VITAL SIGNS:  See vs page GENERAL: no distress GENITALIA: Normal male testicles, scrotum, and penis.    Lab Results  Component Value Date   TESTOSTERONE 525 11/11/2013   Lab Results  Component Value Date   PSA 0.34 11/11/2013   PSA 0.23 01/19/2013      Assessment & Plan:  Hypogonadism: well-controlled   Patient is advised the following: Patient Instructions  Please continue the same medication. normalization of testosterone is not known to harm you.  however, there are "theoretical" risks, including increased fertility, hair loss,  prostate cancer, benign prostate enlargement, blood clots, liver problems, lower hdl ("good cholesterol"), sleep apnea, and behavior changes.   Please come back for a follow-up appointment in 1 year  Please continue the same medications

## 2013-12-30 ENCOUNTER — Encounter: Payer: Self-pay | Admitting: Emergency Medicine

## 2014-01-31 ENCOUNTER — Other Ambulatory Visit: Payer: Self-pay | Admitting: Endocrinology

## 2014-03-07 ENCOUNTER — Ambulatory Visit (INDEPENDENT_AMBULATORY_CARE_PROVIDER_SITE_OTHER): Payer: 59 | Admitting: Emergency Medicine

## 2014-03-07 VITALS — BP 128/80 | HR 71 | Temp 98.8°F | Resp 16 | Ht 68.5 in | Wt 196.4 lb

## 2014-03-07 DIAGNOSIS — H9313 Tinnitus, bilateral: Secondary | ICD-10-CM

## 2014-03-07 MED ORDER — TRIAMCINOLONE ACETONIDE 55 MCG/ACT NA AERO: 2.0000 | INHALATION_SPRAY | Freq: Every day | NASAL | Status: DC

## 2014-03-07 NOTE — Patient Instructions (Signed)
Tinnitus °Sounds you hear in your ears and coming from within the ear is called tinnitus. This can be a symptom of many ear disorders. It is often associated with hearing loss.  °Tinnitus can be seen with: °· Infections. °· Ear blockages such as wax buildup. °· Meniere's disease. °· Ear damage. °· Inherited. °· Occupational causes. °While irritating, it is not usually a threat to health. When the cause of the tinnitus is wax, infection in the middle ear, or foreign body it is easily treated. Hearing loss will usually be reversible.  °TREATMENT  °When treating the underlying cause does not get rid of tinnitus, it may be necessary to get rid of the unwanted sound by covering it up with more pleasant background noises. This may include music, the radio etc. There are tinnitus maskers which can be worn which produce background noise to cover up the tinnitus. °Avoid all medications which tend to make tinnitus worse such as alcohol, caffeine, aspirin, and nicotine. There are many soothing background tapes such as rain, ocean, thunderstorms, etc. These soothing sounds help with sleeping or resting. °Keep all follow-up appointments and referrals. This is important to identify the cause of the problem. It also helps avoid complications, impaired hearing, disability, or chronic pain. °Document Released: 04/08/2005 Document Revised: 07/01/2011 Document Reviewed: 11/25/2007 °ExitCare® Patient Information ©2015 [employer]. This information is not intended to replace advice given to you by your health care provider. Make sure you discuss any questions you have with your health care provider. ° °

## 2014-03-07 NOTE — Progress Notes (Signed)
[facility] [address], [place] [id number] [phone]  Date:  03/07/2014   Name:  Adam MornGregory T Kelliher   DOB:  11/01/1960   MRN:  [id number]  PCP:  Lucilla EdinAUB, STEVE A, MD    Chief Complaint: inner ear issue   History of Present Illness:  Adam MornGregory T Hoheisel is a 53 y.o. very pleasant male patient who presents with the following:  Says he has nasal congestion and drainage.  Says his head feels as though there is a clicking noise in his head No history of injury or fever or chills.   No neuro or visual symptoms.   No improvement with over the counter medications or other home remedies. Denies other complaint or health concern today.   Patient Active Problem List   Diagnosis Date Noted  . Hypogonadism male 02/23/2013  . Cough 12/30/2011  . 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 Pre-operative respiratory examination 10/25/2010  . Acute sinusitis 08/10/2010  . Chest pain 07/19/2010  . Hypertriglyceridemia 07/19/2010  . Hypoxemia 07/17/2010    Past Medical History  Diagnosis Date  . Hypertriglyceridemia   . Seasonal allergies   . Hypoxemia   . Alveolar/parietoalveolar pneumonopathy, other   . 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 No pertinent past surgical history.  History  Substance Use Topics  . Smoking status: Former Smoker -- 0.50 packs/day for 8 years    Types: Cigarettes    Quit date: 04/22/1985  . Smokeless tobacco: Never Used  . Alcohol Use: 1.2 oz/week    2 Shots of liquor per week    Family History  Problem Relation Age of Onset  . Stroke Father [id number]    No Known Allergies  Medication list has been reviewed and updated.  Current Outpatient Prescriptions on File Prior to Visit  Medication Sig Dispense Refill  . clomiPHENE (CLOMID) 50 MG tablet TAKE 1/4 TABLET BY MOUTH DAILY 15 tablet 2  . Multiple Vitamins-Minerals (MULTIVITAMIN WITH MINERALS) tablet Take 1 tablet by mouth daily.       No current  facility-administered medications on file prior to visit.    Review of Systems:  As per HPI, otherwise negative.    Physical Examination: Filed Vitals:   03/07/14 1947  BP: 128/80  Pulse: 71  Temp: 98.8 F (37.1 C)  Resp: 16   Filed Vitals:   03/07/14 1947  Height: 5' 8.5" (1.74 m)  Weight: 196 lb 6 oz (89.075 kg)   Body mass index is 29.42 kg/(m^2). Ideal Body Weight: Weight in (lb) to have BMI = 25: 166.5  GEN: WDWN, NAD, Non-toxic, A & O x 3 HEENT: Atraumatic, Normocephalic. Neck supple. No masses, No LAD. Ears and Nose: No external deformity. CV: RRR, No M/G/R. No JVD. No thrill. No extra heart sounds. PULM: CTA B, no wheezes, crackles, rhonchi. No retractions. No resp. distress. No accessory muscle use. ABD: S, NT, ND, +BS. No rebound. No HSM. EXTR: No c/c/e NEURO Normal gait.  PSYCH: Normally interactive. Conversant. Not depressed or anxious appearing.  Calm demeanor.    Assessment and Plan: Persistent clicking like a ratchet in his ears  Signed,  Phillips OdorJeffery Manvir Thorson, MD

## 2014-08-16 ENCOUNTER — Ambulatory Visit (INDEPENDENT_AMBULATORY_CARE_PROVIDER_SITE_OTHER): Payer: 59

## 2014-08-16 ENCOUNTER — Ambulatory Visit (INDEPENDENT_AMBULATORY_CARE_PROVIDER_SITE_OTHER): Payer: 59 | Admitting: Emergency Medicine

## 2014-08-16 ENCOUNTER — Encounter: Payer: Self-pay | Admitting: Emergency Medicine

## 2014-08-16 VITALS — BP 128/81 | HR 72 | Temp 97.9°F | Resp 16 | Ht 69.0 in | Wt 190.0 lb

## 2014-08-16 DIAGNOSIS — M545 Low back pain, unspecified: Secondary | ICD-10-CM

## 2014-08-16 DIAGNOSIS — Z23 Encounter for immunization: Secondary | ICD-10-CM | POA: Diagnosis not present

## 2014-08-16 DIAGNOSIS — J679 Hypersensitivity pneumonitis due to unspecified organic dust: Secondary | ICD-10-CM

## 2014-08-16 DIAGNOSIS — M549 Dorsalgia, unspecified: Secondary | ICD-10-CM | POA: Insufficient documentation

## 2014-08-16 DIAGNOSIS — E781 Pure hyperglyceridemia: Secondary | ICD-10-CM | POA: Diagnosis not present

## 2014-08-16 DIAGNOSIS — Z0000 Encounter for general adult medical examination without abnormal findings: Secondary | ICD-10-CM | POA: Diagnosis not present

## 2014-08-16 DIAGNOSIS — Z125 Encounter for screening for malignant neoplasm of prostate: Secondary | ICD-10-CM

## 2014-08-16 LAB — CBC WITH DIFFERENTIAL/PLATELET
BASOS ABS: 0.1 10*3/uL (ref 0.0–0.1)
Basophils Relative: 1 % (ref 0–1)
EOS ABS: 0.3 10*3/uL (ref 0.0–0.7)
EOS PCT: 4 % (ref 0–5)
HCT: 47.3 % (ref 39.0–52.0)
HEMOGLOBIN: 16.3 g/dL (ref 13.0–17.0)
Lymphocytes Relative: 16 % (ref 12–46)
Lymphs Abs: 1.1 10*3/uL (ref 0.7–4.0)
MCH: 29.6 pg (ref 26.0–34.0)
MCHC: 34.5 g/dL (ref 30.0–36.0)
MCV: 86 fL (ref 78.0–100.0)
MPV: 9.1 fL (ref 8.6–12.4)
Monocytes Absolute: 0.9 10*3/uL (ref 0.1–1.0)
Monocytes Relative: 13 % — ABNORMAL HIGH (ref 3–12)
NEUTROS PCT: 66 % (ref 43–77)
Neutro Abs: 4.6 10*3/uL (ref 1.7–7.7)
Platelets: 157 10*3/uL (ref 150–400)
RBC: 5.5 MIL/uL (ref 4.22–5.81)
RDW: 14.2 % (ref 11.5–15.5)
WBC: 6.9 10*3/uL (ref 4.0–10.5)

## 2014-08-16 LAB — COMPLETE METABOLIC PANEL WITH GFR
ALBUMIN: 4.6 g/dL (ref 3.5–5.2)
ALK PHOS: 41 U/L (ref 39–117)
ALT: 27 U/L (ref 0–53)
AST: 26 U/L (ref 0–37)
BUN: 18 mg/dL (ref 6–23)
CHLORIDE: 103 meq/L (ref 96–112)
CO2: 27 meq/L (ref 19–32)
Calcium: 9.4 mg/dL (ref 8.4–10.5)
Creat: 1.23 mg/dL (ref 0.50–1.35)
GFR, EST AFRICAN AMERICAN: 76 mL/min
GFR, Est Non African American: 66 mL/min
GLUCOSE: 77 mg/dL (ref 70–99)
Potassium: 4.8 mEq/L (ref 3.5–5.3)
SODIUM: 140 meq/L (ref 135–145)
Total Bilirubin: 0.9 mg/dL (ref 0.2–1.2)
Total Protein: 7.1 g/dL (ref 6.0–8.3)

## 2014-08-16 LAB — IFOBT (OCCULT BLOOD): IMMUNOLOGICAL FECAL OCCULT BLOOD TEST: NEGATIVE

## 2014-08-16 LAB — LIPID PANEL
CHOL/HDL RATIO: 6.2 ratio
Cholesterol: 180 mg/dL (ref 0–200)
HDL: 29 mg/dL — AB (ref >=40)
LDL CALC: 73 mg/dL (ref 0–99)
Triglycerides: 392 mg/dL — ABNORMAL HIGH (ref <150)
VLDL: 78 mg/dL — AB (ref 0–40)

## 2014-08-16 NOTE — Progress Notes (Addendum)
Subjective:    Patient ID: Adam Chang, male    DOB: Mar 07, 1961, 54 y.o.   MRN: [id number] This chart was scribed for Lesle Chris, MD by Littie Deeds, Medical Scribe. This patient was seen in room 22 and the patient's care was started at 8:33 AM.   HPI HPI Comments: Adam Chang is a 54 y.o. male who [facility] complete physical exam. Patient has had the Prevnar vaccine, but not the pneumovax vaccine. He has been doing well overall. Patient has been having some constant lower back pain. He has had XR imaging in the past and MRI imaging in 2004 by Dr. Christell Constant. Patient would like to see another specialist for his back as he had not seen one in a long time. He has been using back braces. He denies chest pain, abdominal pain, and any respiratory problems. Patient has not been exercising as much recently as his work hours are earlier now, starting around 5:30 AM.  Patient recently came back from [place] for a work trip.  Review of Systems  Respiratory: Negative for shortness of breath.   Cardiovascular: Negative for chest pain.  Gastrointestinal: Negative for abdominal pain.  Musculoskeletal: Positive for back pain.       Objective:   Physical Exam CONSTITUTIONAL: Well developed/well nourished HEAD: Normocephalic/atraumatic EYES: EOM/PERRL ENMT: Mucous membranes moist NECK: supple no meningeal signs SPINE: Tenderness along lower lumbar spine. CV: S1/S2 noted, no murmurs/rubs/gallops noted LUNGS: Lungs are clear to auscultation bilaterally, no apparent distress ABDOMEN: soft, nontender, no rebound or guarding GU: Small symmetrical prostate without nodules. NEURO: Pt is awake/alert, moves all extremitiesx4. Motor strength is symmetrical. EXTREMITIES: pulses normal, full ROM SKIN: warm, color normal PSYCH: no abnormalities of mood noted [facility] reading (PRIMARY) by  Dr.Arrayah Connors there is mild narrowing L5-S1 Results for orders placed or performed  in visit on 08/16/14  IFOBT POC (occult bld, rslt in office)  Result Value Ref Range   IFOBT Negative     [facility] reading (PRIMARY) by  Dr. Cleta Alberts there is mild narrowing at L5-S1.        Assessment & Plan:  1. Routine general medical examination at a health care facility  - IFOBT POC (occult bld, rslt in office)  2. Hypertriglyceridemia  - COMPLETE METABOLIC PANEL WITH GFR - Lipid panel  3. Hypersensitivity pneumonitis  - CBC with Differential/Platelet - EKG 12-Lead - Pneumococcal polysaccharide vaccine 23-valent greater than or equal to 2yo subcutaneous/IM  4. Midline low back pain without sciatica  - DG Lumbar Spine 2-3 Views; Future referral made to have an MRI.  5. Prostate cancer screening  - PSA   Lesle Chris, MD  [facility]  08/16/2014 9:26 AM

## 2014-08-17 LAB — PSA: PSA: 0.38 ng/mL (ref <=4.00)

## 2014-10-11 ENCOUNTER — Ambulatory Visit (INDEPENDENT_AMBULATORY_CARE_PROVIDER_SITE_OTHER): Payer: 59 | Admitting: Family Medicine

## 2014-10-11 VITALS — BP 118/76 | HR 72 | Temp 98.5°F | Resp 16 | Ht 69.0 in | Wt 195.8 lb

## 2014-10-11 DIAGNOSIS — R21 Rash and other nonspecific skin eruption: Secondary | ICD-10-CM

## 2014-10-11 DIAGNOSIS — L259 Unspecified contact dermatitis, unspecified cause: Secondary | ICD-10-CM

## 2014-10-11 MED ORDER — TRIAMCINOLONE ACETONIDE 0.1 % EX CREA: 1.0000 "application " | TOPICAL_CREAM | Freq: Four times a day (QID) | CUTANEOUS | Status: DC | PRN

## 2014-10-11 MED ORDER — MUPIROCIN CALCIUM 2 % EX CREA: 1.0000 "application " | TOPICAL_CREAM | Freq: Three times a day (TID) | CUTANEOUS | Status: DC

## 2014-10-11 NOTE — Progress Notes (Signed)
Subjective:  This chart was scribed for Shade Flood, MD by Charline Bills, ED Scribe. The patient was seen in room 12. Patient's care was started at 3:27 PM.   Patient ID: Adam Chang, male    DOB: 28-Oct-1960, 54 y.o.   MRN: [id number]  Chief Complaint  Patient presents with  . Rash    Lower legs, bilateal, since December, itchy   HPI HPI Comments: Adam Chang is a 54 y.o. male who presents to the [facility] for a lower extremity rash. H/o hypogonadism idiopathic treated with Axiron, then Clomid. Pt is followed by Dr. Everardo All with most recent visit August 2015.   Pt reports persistent itchy rash to right lower leg for the past 6 months. He reports associated redness to rash on the right lower leg, bleeding from scratching and intermittent itching to the left lower leg. Pt denies pain, wheezing, SOB. Pt also denies new chemical exposure, creams, lotions, soaps; he typically uses [place] soap. He has tried Benadryl cream, spray and pills; pills provided the most relief but made him drowsy. He has also tried cortisone cream every few hours for 1-2 weeks with temporary relief, [place] anti-itch with only 15 minutes of relief and Aveeno which exacerbates itching. He has a dog and cat at home that stays indoors. Pt receives allergy shots for grass allergies; his next appointment with his allergist is in approximately 6 months.  Pt works as an Personnel officer.  PCP: Lucilla Edin, MD  Patient Active Problem List   Diagnosis Date Noted  . Back pain 08/16/2014  . Hypogonadism male 02/23/2013  . Cough 12/30/2011  . 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 Pre-operative respiratory examination 10/25/2010  . Acute sinusitis 08/10/2010  . Chest pain 07/19/2010  . Hypertriglyceridemia 07/19/2010  . Hypoxemia 07/17/2010   Past Medical History  Diagnosis Date  . Hypertriglyceridemia   . Seasonal allergies   . Hypoxemia   . Alveolar/parietoalveolar  pneumonopathy, other   . 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 No pertinent past surgical history. No Known Allergies Prior to Admission medications   Medication Sig Start Date End Date Taking? Authorizing Provider  clomiPHENE (CLOMID) 50 MG tablet TAKE 1/4 TABLET BY MOUTH DAILY 12/15/13  Yes Romero Belling, MD  levocetirizine (XYZAL) 5 MG tablet Take 5 mg by mouth every evening.   Yes Historical Provider, MD  montelukast (SINGULAIR) 10 MG tablet Take 10 mg by mouth at bedtime.   Yes Historical Provider, MD  Multiple Vitamins-Minerals (MULTIVITAMIN WITH MINERALS) tablet Take 1 tablet by mouth daily.     Yes Historical Provider, MD  triamcinolone (NASACORT AQ) 55 MCG/ACT AERO nasal inhaler Place 2 sprays into the nose daily. Patient not taking: Reported on 10/11/2014 03/07/14   Carmelina Dane, MD   History   Social History  . Marital Status: Married    Spouse Name: Pam  . Number of Children: N/A  . Years of Education: college   Occupational History  . electrician   . TECHNICIAN    Social History Main Topics  . Smoking status: Former Smoker -- 0.50 packs/day for 8 years    Types: Cigarettes    Quit date: 04/22/1985  . Smokeless tobacco: Never Used  . Alcohol Use: 1.2 oz/week    2 Shots of liquor per week  . Drug Use: No  . Sexual Activity: Yes    Birth Control/ Protection: None   Other Topics Concern  . Not on file  Social History Narrative   Denies any TB exposure.  Denies having any mold     in the house.  Denies having birds.  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  He denies asbestos exposure.  Denies any     nitrofurantoin or amiodarone intake.       Exercises. Amount varies.   Review of Systems  Respiratory:  Negative for shortness of breath and wheezing.   Skin: Positive for rash.  Allergic/Immunologic: Positive for environmental allergies.      Objective:   Physical Exam  Constitutional: He is oriented to person, place, and time. He appears well-developed and well-nourished. No distress.  HENT:  Head: Normocephalic and atraumatic.  Eyes: Conjunctivae and EOM are normal.  Neck: Neck supple. No tracheal deviation present.  Cardiovascular: Normal rate.   Pulmonary/Chest: Effort normal. No respiratory distress.  Musculoskeletal: Normal range of motion.  Neurological: He is alert and oriented to person, place, and time.  Skin: Skin is warm and dry. Rash noted.  R lower leg anterior and lateral aspect: Erythematous patch measuring approximately 19 cm x 9 cm. Slightly indurated but primarily excoriated with a few raw appearing areas from excoriation. There are no other rashes noted.    Psychiatric: He has a normal mood and affect. His behavior is normal.  Nursing note and vitals reviewed.  Filed Vitals:   10/11/14 1517  BP: 118/76  Pulse: 72  Temp: 98.5 F (36.9 C)  TempSrc: Oral  Resp: 16  Height: 5\' 9"  (1.753 m)  Weight: 195 lb 12.8 oz (88.814 kg)  SpO2: 98%      Assessment & Plan:   Adam Chang is a 54 y.o. male Contact dermatitis - Plan: triamcinolone cream (KENALOG) 0.1 %  Rash and nonspecific skin eruption - Plan: triamcinolone cream (KENALOG) 0.1 %, mupirocin cream (BACTROBAN) 2 %  Suspected contact dermatitis based on memory symptoms of pruritus, and secondary excoriation. May be a form of atopic derm but just involved in right lower leg to suggest a contact component.   -Trial of more potent steroid cream with Kenalog 0.1% 4 times a day when necessary, mild soap such as Dove, Eucerin if needed for dry skin and Allegra by mouth daily  -Also prescribed Bactroban if redness or raw area not improving with steroid as may have secondary infectious component  -If not  improved in the next week consider dermatology referral. Return to clinic sooner if worse  Meds ordered this encounter  Medications  . triamcinolone cream (KENALOG) 0.1 %    Sig: Apply 1 application topically 4 (four) times daily as needed.    Dispense:  30 g    Refill:  0  . mupirocin cream (BACTROBAN) 2 %    Sig: Apply 1 application topically 3 (three) times daily. To affected area for 1 week.    Dispense:  15 g    Refill:  0   Patient Instructions  Start stronger steroid cream (triamcinolone) up to 4 times per day, over the counter allegra once per day. If redness persists next day or two - start antibiotic ointment as skin may be secondarily infected from scratches. If not improving in next week -  can refer you to dermatologist if needed. Return to the clinic or go to the nearest emergency room if any of your symptoms worsen or new symptoms occur.  Contact Dermatitis Contact dermatitis is a reaction to certain substances that touch the skin. Contact dermatitis can be either irritant contact dermatitis or allergic contact dermatitis. Irritant contact dermatitis does not require previous exposure to the substance for a reaction to occur.Allergic contact dermatitis only occurs if you have been exposed to the substance before. Upon a repeat exposure, your body reacts to the substance.  CAUSES  Many substances can cause contact dermatitis. Irritant dermatitis is most commonly caused by repeated exposure to mildly irritating substances, such as:  Makeup.  Soaps.  Detergents.  Bleaches.  Acids.  Metal salts, such as nickel. Allergic contact dermatitis is most commonly caused by exposure to:  Poisonous plants.  Chemicals (deodorants, shampoos).  Jewelry.  Latex.  Neomycin in triple antibiotic cream.  Preservatives in products, including clothing. SYMPTOMS  The area of skin that is exposed may develop:  Dryness or flaking.  Redness.  Cracks.  Itching.  Pain or a  burning sensation.  Blisters. With allergic contact dermatitis, there may also be swelling in areas such as the eyelids, mouth, or genitals.  DIAGNOSIS  Your caregiver can usually tell what the problem is by doing a physical exam. In cases where the cause is uncertain and an allergic contact dermatitis is suspected, a patch skin test may be performed to help determine the cause of your dermatitis. TREATMENT Treatment includes protecting the skin from further contact with the irritating substance by avoiding that substance if possible. Barrier creams, powders, and gloves may be helpful. Your caregiver may also recommend:  Steroid creams or ointments applied 2 times daily. For best results, soak the rash area in cool water for 20 minutes. Then apply the medicine. Cover the area with a plastic wrap. You can store the steroid cream in the refrigerator for a "chilly" effect on your rash. That may decrease itching. Oral steroid medicines may be needed in more severe cases.  Antibiotics or antibacterial ointments if a skin infection is present.  Antihistamine lotion or an antihistamine taken by mouth to ease itching.  Lubricants to keep moisture in your skin.  Burow's solution to reduce redness and soreness or to dry a weeping rash. Mix one packet or tablet of solution in 2 cups cool water. Dip a clean washcloth in the mixture, wring it out a bit, and put it on the affected area. Leave the cloth in place for 30 minutes. Do this as often as possible throughout the day.  Taking several cornstarch or baking soda baths daily if the area is too large to cover with a washcloth. Harsh chemicals, such as alkalis or acids, can cause skin damage that is like a burn. You should flush your skin for 15 to 20 minutes with cold water after such an exposure. You should also seek immediate medical care after exposure. Bandages (dressings), antibiotics, and pain medicine may be needed for severely irritated skin.  HOME  CARE INSTRUCTIONS  Avoid the substance that caused your reaction.  Keep the area of skin that is affected away from hot water, soap, sunlight, chemicals, acidic substances, or anything else that would irritate your skin.  Do not scratch the rash. Scratching may cause the rash to become infected.  You may take cool baths to help stop the itching.  Only take over-the-counter or prescription medicines as directed by your caregiver.  See your caregiver for follow-up care as directed to make sure your skin is healing properly. SEEK MEDICAL CARE IF:   Your condition is not better after 3 days of treatment.  You seem to be getting worse.  You see signs of infection such as swelling, tenderness, redness, soreness, or warmth in the affected area.  You have any problems related to your medicines. Document Released: 04/05/2000 Document Revised: 07/01/2011 Document Reviewed: 09/11/2010 [facility] Patient Information 2015 [place], [place]. This information is not intended to replace advice given to you by your health care provider. Make sure you discuss any questions you have with your health care provider.     I personally performed the services described in this documentation, which was scribed in my presence. The recorded information has been reviewed and considered, and addended by me as needed.

## 2014-10-11 NOTE — Patient Instructions (Signed)
Start stronger steroid cream (triamcinolone) up to 4 times per day, over the counter allegra once per day. If redness persists next day or two - start antibiotic ointment as skin may be secondarily infected from scratches. If not improving in next week - can refer you to dermatologist if needed. Return to the clinic or go to the nearest emergency room if any of your symptoms worsen or new symptoms occur.  Contact Dermatitis Contact dermatitis is a reaction to certain substances that touch the skin. Contact dermatitis can be either irritant contact dermatitis or allergic contact dermatitis. Irritant contact dermatitis does not require previous exposure to the substance for a reaction to occur.Allergic contact dermatitis only occurs if you have been exposed to the substance before. Upon a repeat exposure, your body reacts to the substance.  CAUSES  Many substances can cause contact dermatitis. Irritant dermatitis is most commonly caused by repeated exposure to mildly irritating substances, such as:  Makeup.  Soaps.  Detergents.  Bleaches.  Acids.  Metal salts, such as nickel. Allergic contact dermatitis is most commonly caused by exposure to:  Poisonous plants.  Chemicals (deodorants, shampoos).  Jewelry.  Latex.  Neomycin in triple antibiotic cream.  Preservatives in products, including clothing. SYMPTOMS  The area of skin that is exposed may develop:  Dryness or flaking.  Redness.  Cracks.  Itching.  Pain or a burning sensation.  Blisters. With allergic contact dermatitis, there may also be swelling in areas such as the eyelids, mouth, or genitals.  DIAGNOSIS  Your caregiver can usually tell what the problem is by doing a physical exam. In cases where the cause is uncertain and an allergic contact dermatitis is suspected, a patch skin test may be performed to help determine the cause of your dermatitis. TREATMENT Treatment includes protecting the skin from further  contact with the irritating substance by avoiding that substance if possible. Barrier creams, powders, and gloves may be helpful. Your caregiver may also recommend:  Steroid creams or ointments applied 2 times daily. For best results, soak the rash area in cool water for 20 minutes. Then apply the medicine. Cover the area with a plastic wrap. You can store the steroid cream in the refrigerator for a "chilly" effect on your rash. That may decrease itching. Oral steroid medicines may be needed in more severe cases.  Antibiotics or antibacterial ointments if a skin infection is present.  Antihistamine lotion or an antihistamine taken by mouth to ease itching.  Lubricants to keep moisture in your skin.  Burow's solution to reduce redness and soreness or to dry a weeping rash. Mix one packet or tablet of solution in 2 cups cool water. Dip a clean washcloth in the mixture, wring it out a bit, and put it on the affected area. Leave the cloth in place for 30 minutes. Do this as often as possible throughout the day.  Taking several cornstarch or baking soda baths daily if the area is too large to cover with a washcloth. Harsh chemicals, such as alkalis or acids, can cause skin damage that is like a burn. You should flush your skin for 15 to 20 minutes with cold water after such an exposure. You should also seek immediate medical care after exposure. Bandages (dressings), antibiotics, and pain medicine may be needed for severely irritated skin.  HOME CARE INSTRUCTIONS  Avoid the substance that caused your reaction.  Keep the area of skin that is affected away from hot water, soap, sunlight, chemicals, acidic substances, or anything  else that would irritate your skin.  Do not scratch the rash. Scratching may cause the rash to become infected.  You may take cool baths to help stop the itching.  Only take over-the-counter or prescription medicines as directed by your caregiver.  See your caregiver for  follow-up care as directed to make sure your skin is healing properly. SEEK MEDICAL CARE IF:   Your condition is not better after 3 days of treatment.  You seem to be getting worse.  You see signs of infection such as swelling, tenderness, redness, soreness, or warmth in the affected area.  You have any problems related to your medicines. Document Released: 04/05/2000 Document Revised: 07/01/2011 Document Reviewed: 09/11/2010 [facility] Patient Information 2015 [place], [place]. This information is not intended to replace advice given to you by your health care provider. Make sure you discuss any questions you have with your health care provider.

## 2014-11-17 ENCOUNTER — Other Ambulatory Visit: Payer: Self-pay | Admitting: Endocrinology

## 2015-01-24 ENCOUNTER — Encounter: Payer: Self-pay | Admitting: Emergency Medicine

## 2015-01-24 ENCOUNTER — Other Ambulatory Visit: Payer: Self-pay | Admitting: Endocrinology

## 2015-02-24 ENCOUNTER — Ambulatory Visit (INDEPENDENT_AMBULATORY_CARE_PROVIDER_SITE_OTHER): Payer: Self-pay | Admitting: Endocrinology

## 2015-02-24 ENCOUNTER — Other Ambulatory Visit: Payer: Self-pay

## 2015-02-24 ENCOUNTER — Other Ambulatory Visit (INDEPENDENT_AMBULATORY_CARE_PROVIDER_SITE_OTHER): Payer: Self-pay

## 2015-02-24 ENCOUNTER — Encounter: Payer: Self-pay | Admitting: Endocrinology

## 2015-02-24 VITALS — BP 137/93 | HR 70 | Temp 98.3°F | Ht 69.0 in | Wt 193.0 lb

## 2015-02-24 DIAGNOSIS — E291 Testicular hypofunction: Secondary | ICD-10-CM

## 2015-02-24 LAB — CBC WITH DIFFERENTIAL/PLATELET
Basophils Absolute: 0.1 10*3/uL (ref 0.0–0.1)
Basophils Relative: 0.9 % (ref 0.0–3.0)
EOS PCT: 3.9 % (ref 0.0–5.0)
Eosinophils Absolute: 0.3 10*3/uL (ref 0.0–0.7)
HEMATOCRIT: 45.8 % (ref 39.0–52.0)
HEMOGLOBIN: 15.2 g/dL (ref 13.0–17.0)
LYMPHS PCT: 26.6 % (ref 12.0–46.0)
Lymphs Abs: 2 10*3/uL (ref 0.7–4.0)
MCHC: 33.2 g/dL (ref 30.0–36.0)
MCV: 87.3 fl (ref 78.0–100.0)
Monocytes Absolute: 0.7 10*3/uL (ref 0.1–1.0)
Monocytes Relative: 9 % (ref 3.0–12.0)
Neutro Abs: 4.6 10*3/uL (ref 1.4–7.7)
Neutrophils Relative %: 59.6 % (ref 43.0–77.0)
Platelets: 185 10*3/uL (ref 150.0–400.0)
RBC: 5.24 Mil/uL (ref 4.22–5.81)
RDW: 13.6 % (ref 11.5–15.5)
WBC: 7.7 10*3/uL (ref 4.0–10.5)

## 2015-02-24 LAB — TSH: TSH: 1.15 u[IU]/mL (ref 0.35–4.50)

## 2015-02-24 NOTE — Progress Notes (Signed)
Subjective:    Patient ID: Adam Chang, male    DOB: May 18, 1960, 54 y.o.   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  He has no h/o infertility.  He has never taken illicit androgens; he has had a vasectomy; in mid-2013, pt presented with emotional lability; he took axiron for a few weeks, but did better on clomid).  pt states he feels well in general.   Past Medical History  Diagnosis Date  . Hypertriglyceridemia   . Seasonal allergies   . Hypoxemia   . Alveolar/parietoalveolar pneumonopathy, other   . Pneumonitis, hypersensitivity (HCC) 10/25/2010    No past surgical history on file.  Social History   Social History  . Marital Status: Married    Spouse Name: Pam  . Number of Children: N/A  . Years of Education: college   Occupational History  . electrician   . TECHNICIAN    Social History Main Topics  . Smoking status: Former Smoker -- 0.50 packs/day for 8 years    Types: Cigarettes    Quit date: 04/22/1985  . Smokeless tobacco: Never Used  . Alcohol Use: 1.2 oz/week    2 Shots of liquor per week  . Drug Use: No  . Sexual Activity: Yes    Birth Control/ Protection: None   Other Topics Concern  . Not on file   Social History Narrative   Denies any TB exposure.  Denies having any mold     in the house.  Denies having birds.  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  He denies asbestos exposure.  Denies any     nitrofurantoin or amiodarone intake.       Exercises. Amount varies.    Current Outpatient Prescriptions on File Prior to Visit  Medication Sig Dispense Refill  . clomiPHENE (CLOMID) 50 MG tablet TAKE 1/4 TABLET BY MOUTH EVERY DAY 15 tablet 0  .  levocetirizine (XYZAL) 5 MG tablet Take 5 mg by mouth every evening.    . montelukast (SINGULAIR) 10 MG tablet Take 10 mg by mouth at bedtime.    . Multiple Vitamins-Minerals (MULTIVITAMIN WITH MINERALS) tablet Take 1 tablet by mouth daily.      . mupirocin cream (BACTROBAN) 2 % Apply 1 application topically 3 (three) times daily. To affected area for 1 week. 15 g 0  . triamcinolone (NASACORT AQ) 55 MCG/ACT AERO nasal inhaler Place 2 sprays into the nose daily. 1 Inhaler 12  . triamcinolone cream (KENALOG) 0.1 % Apply 1 application topically 4 (four) times daily as needed. 30 g 0   No current facility-administered medications on file prior to visit.    No Known Allergies  Family History  Problem Relation Age of Onset  . Stroke Father 70    BP 137/93 mmHg  Pulse 70  Temp(Src) 98.3 F (36.8 C) (Oral)  Ht  (1.753 m)  Wt 193 lb (87.544 kg)  BMI 28.49 kg/m2  SpO2 95%  Review of Systems Denies muscle weakness, ED sxs, and decreased urinary stream.      Objective:   Physical Exam VITAL SIGNS:  See vs  page GENERAL: no distress Ext: no edema.    Lab Results  Component Value Date   TESTOSTERONE 442 02/24/2015      Assessment & Plan:  Hypogonadism: well-controlled.  HTN, with likely situational component.  I advised pt to have rechecked with Dr Cleta Albertsaub.  Patient is advised the following: Patient Instructions  blood tests are requested for you today.  We'll let you know about the results.   normalization of testosterone is not known to harm you.  however, there are "theoretical" risks, including increased fertility, hair loss, prostate cancer, benign prostate enlargement, blood clots, liver problems, lower hdl ("good cholesterol"), sleep apnea, and behavior changes.   Please come back for a follow-up appointment in 1 year.

## 2015-02-24 NOTE — Patient Instructions (Addendum)
blood tests are requested for you today.  We'll let you know about the results.   normalization of testosterone is not known to harm you.  however, there are "theoretical" risks, including increased fertility, hair loss, prostate cancer, benign prostate enlargement, blood clots, liver problems, lower hdl ("good cholesterol"), sleep apnea, and behavior changes.   Please come back for a follow-up appointment in 1 year.

## 2015-02-27 LAB — TESTOSTERONE, FREE, TOTAL, SHBG
Sex Hormone Binding: 34 nmol/L (ref 22–77)
TESTOSTERONE: 442 ng/dL (ref 300–890)
Testosterone, Free: 89.4 pg/mL (ref 47.0–244.0)
Testosterone-% Free: 2 % (ref 1.6–2.9)

## 2015-03-26 ENCOUNTER — Other Ambulatory Visit: Payer: Self-pay | Admitting: Endocrinology

## 2015-06-07 ENCOUNTER — Other Ambulatory Visit: Payer: Self-pay | Admitting: Endocrinology

## 2015-08-17 ENCOUNTER — Other Ambulatory Visit: Payer: Self-pay | Admitting: Endocrinology

## 2015-11-13 ENCOUNTER — Other Ambulatory Visit: Payer: Self-pay | Admitting: Endocrinology

## 2016-01-14 ENCOUNTER — Other Ambulatory Visit: Payer: Self-pay | Admitting: Endocrinology

## 2016-04-06 ENCOUNTER — Other Ambulatory Visit: Payer: Self-pay | Admitting: Endocrinology

## 2016-04-06 NOTE — Telephone Encounter (Signed)
Please refill x 2 mos Ov is due 

## 2016-08-11 ENCOUNTER — Other Ambulatory Visit: Payer: Self-pay | Admitting: Endocrinology

## 2016-08-11 NOTE — Telephone Encounter (Signed)
Please refill x 1 Ov is due  

## 2017-10-27 ENCOUNTER — Other Ambulatory Visit: Payer: Self-pay | Admitting: Endocrinology

## 2017-10-28 NOTE — Telephone Encounter (Signed)
Please refill x 1 Ov is due  

## 2018-03-09 ENCOUNTER — Other Ambulatory Visit: Payer: Self-pay | Admitting: Endocrinology

## 2019-07-09 ENCOUNTER — Ambulatory Visit: Payer: Self-pay | Attending: Internal Medicine

## 2019-07-09 DIAGNOSIS — Z23 Encounter for immunization: Secondary | ICD-10-CM

## 2019-07-09 NOTE — Progress Notes (Signed)
   Covid-19 Vaccination Clinic  Name:  Adam Chang    MRN: [id number] DOB: October 15, 1960  07/09/2019  [name] was observed post Covid-19 immunization for 15 minutes without incident. He was provided with Vaccine Information Sheet and instruction to access the V-Safe system.   [name] was instructed to call 911 with any severe reactions post vaccine: Marland Kitchen Difficulty breathing  . Swelling of face and throat  . A fast heartbeat  . A bad rash all over body  . Dizziness and weakness   Immunizations Administered    Name Date Dose VIS Date Route   Pfizer COVID-19 Vaccine 07/09/2019  3:57 PM 0.3 mL 04/02/2019 Intramuscular   Manufacturer: [employer], [employer]   Lot: [id number]   NDC: [address]-1

## 2019-08-04 ENCOUNTER — Ambulatory Visit: Payer: Self-pay | Attending: Internal Medicine

## 2019-08-04 DIAGNOSIS — Z23 Encounter for immunization: Secondary | ICD-10-CM

## 2019-08-04 NOTE — Progress Notes (Signed)
   Covid-19 Vaccination Clinic  Name:  Adam Chang    MRN: [id number] DOB: 27-Jan-1961  08/04/2019  [name] was observed post Covid-19 immunization for 15 minutes without incident. He was provided with Vaccine Information Sheet and instruction to access the V-Safe system.   [name] was instructed to call 911 with any severe reactions post vaccine: Marland Kitchen Difficulty breathing  . Swelling of face and throat  . A fast heartbeat  . A bad rash all over body  . Dizziness and weakness   Immunizations Administered    Name Date Dose VIS Date Route   Pfizer COVID-19 Vaccine 08/04/2019  4:01 PM 0.3 mL 04/02/2019 Intramuscular   Manufacturer: [employer], [employer]   Lot: [id number]   NDC: [address]-1

## 2022-06-06 ENCOUNTER — Encounter: Payer: Self-pay | Admitting: Neurology

## 2022-06-06 ENCOUNTER — Ambulatory Visit: Payer: 59 | Admitting: Neurology

## 2022-06-06 ENCOUNTER — Encounter: Payer: Self-pay | Admitting: *Deleted

## 2022-06-06 VITALS — BP 136/89 | HR 81 | Ht 69.0 in | Wt 198.4 lb

## 2022-06-06 DIAGNOSIS — G250 Essential tremor: Secondary | ICD-10-CM | POA: Diagnosis not present

## 2022-06-06 MED ORDER — PROPRANOLOL HCL 20 MG PO TABS: 20.0000 mg | ORAL_TABLET | Freq: Three times a day (TID) | ORAL | 3 refills | Status: DC

## 2022-06-06 NOTE — Progress Notes (Signed)
Subjective:    Patient ID: Adam Chang is a 62 y.o. male.  HPI    Star Age, MD, PhD [facility] [address]O. [place], [place] [address]  Dear Adam Chang,  I saw your patient, Adam Chang, upon your kind request in my neurologic clinic today for initial consultation of his tremors.  The patient is unaccompanied today.  As you know, [name] is a 62 year old male with an underlying medical history of hypertension, hyperlipidemia, mitral valve prolapse, history of pneumonitis, seasonal allergies, tinnitus, history of heart murmur, chest pain, and overweight state, who reports a longstanding history of hand tremors.  He reports having had a hand tremor since his 47s.  He is not aware of any family history of tremors, neither parent had tremors but mom died young at the age of 26 from a choking accident.  His father lived to be 96 and had a stroke.  He has 4 siblings but does not keep in touch with them and does not know their medical history.  He is married and lives with his wife, he is a retired Clinical biochemist.  He has noticed difficulty with fine motor skills and feeding himself, holding something, and handwriting.  He is right-handed.  He has not tried any beta-blocker, he drinks caffeine in the form of coffee, 1 cup per day and 1 soda per day and diluted tea during the day.  He drinks alcohol rarely, he quit smoking some 40 years ago.  I reviewed your office records including e-visit note from 06/03/2022.  he has not been on any symptomatic medications for this.  No recent labs were available for review.  Of note, he has been on sertraline for years.  He has a 100 mg pill but takes 1/4 pill daily only.  No history of asthma, no history of treatment resistant depression.  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   Seasonal allergies    Tinnitus of both ears     His Past Surgical History Is Significant For: Past Surgical History:  Procedure Laterality Date   BACK SURGERY     2023   SHOULDER SURGERY Right    2020 approx    His Family History Is Significant For: Family History  Problem Relation Age of Onset   Stroke Father 51    His Social History Is Significant For: Social History   Socioeconomic History   Marital status: Married    Spouse name: Pam   Number of children: Not on file   Years of education: college   Highest education level: Not on file  Occupational History   Occupation: Clinical biochemist   Occupation: TECHNICIAN    Employer: [employer]  Tobacco Use   Smoking status: Former    Packs/day: 0.50    Years: 8.00    Total pack years: 4.00    Types: Cigarettes    Quit date: 04/22/1985    Years since quitting: 37.1   Smokeless tobacco: Never  Vaping Use   Vaping Use: Never used  Substance and Sexual Activity   Alcohol use: Yes    Alcohol/week: 2.0 standard drinks of alcohol    Types:  2 Shots of liquor per week    Comment: occaaional   Drug use: No   Sexual activity: Yes    Birth control/protection: None  Other Topics Concern   Not on file  Social History Narrative   Denies any TB exposure.  Denies having any mold  in the house.  Denies having birds.  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  He denies asbestos exposure.  Denies any  nitrofurantoin or amiodarone intake. Exercises. Amount varies.   Caffiene:  1 cup coffee in am, 24 oz diluted tea noon, and 1 soda pm    Retired Clinical biochemist, but possibility of returning to work.   Social Determinants of Health   Financial Resource Strain: Not on file  Food Insecurity: Not on file  Transportation Needs: Not on file  Physical Activity: Not on file  Stress: Not on file  Social Connections: Not on file    His Allergies Are:  No Known Allergies:   His Current Medications Are:  Outpatient Encounter Medications as of 06/06/2022  Medication Sig   albuterol (VENTOLIN HFA) 108 (90 Base) MCG/ACT inhaler Inhale 2 puffs into the lungs every 6 (six) hours as needed.   cetirizine (ZYRTEC) 10 MG tablet Take 10 mg by mouth daily.   clomiPHENE (CLOMID) 50 MG tablet TAKE 1/4 TABLET BY MOUTH EVERY DAY   fluticasone (FLONASE) 50 MCG/ACT nasal spray Place 1 spray into both nostrils daily.   losartan (COZAAR) 100 MG tablet Take 100 mg by mouth daily.   montelukast (SINGULAIR) 10 MG tablet Take 10 mg by mouth at bedtime.   Multiple Vitamins-Minerals (MULTIVITAMIN WITH MINERALS) tablet Take 1 tablet by mouth daily.     sertraline (ZOLOFT) 100 MG tablet Take 100 mg by mouth daily.   No facility-administered encounter medications on file as of 06/06/2022.  :   Review of Systems:  Out of a complete 14 point review of systems, all are reviewed and negative with the exception of these symptoms as listed below:  Review of Systems  Neurological:        Tremors/shaking since in his 20's.  When exercises is worse.  Bilateral arm/hand tremors.     Objective:  Neurological Exam  Physical Exam Physical Examination:   Vitals:   06/06/22 0843  BP: 136/89  Pulse: 81    General Examination: The patient is a very pleasant 62 y.o. male in no acute distress. He appears well-developed and well-nourished and well groomed.   HEENT: Normocephalic, atraumatic, pupils are equal, round and reactive to light, corrective eyeglasses in place, slight evidence of cataracts bilaterally.  Extraocular tracking is good without limitation to  gaze excursion or nystagmus noted. Hearing is grossly intact. Face is symmetric with normal facial animation. Speech is clear with no dysarthria noted. There is no hypophonia. There is no lip, neck/head, jaw or voice tremor. Neck is supple with full range of passive and active motion. There are no carotid bruits on auscultation. Oropharynx exam reveals: mild mouth dryness, adequate dental hygiene and mild airway crowding. Tongue protrudes centrally and palate elevates symmetrically.   Chest: Clear to auscultation without wheezing, rhonchi or crackles noted.  Heart: S1+S2+0, regular and normal without murmurs, rubs or gallops noted.  Mild systolic murmur noted.  Abdomen: Soft,  non-tender and non-distended.  Extremities: There is no pitting edema in the distal lower extremities bilaterally.   Skin: Warm and dry without trophic changes noted.   Musculoskeletal: exam reveals no obvious joint deformities.   Neurologically:  Mental status: The patient is awake, alert and oriented in all 4 spheres. His immediate and remote memory, attention, language skills and fund of knowledge are appropriate. There is no evidence of aphasia, agnosia, apraxia or anomia. Speech is clear with normal prosody and enunciation. Thought process is linear. Mood is normal and affect is normal.  Cranial nerves II - XII are as described above under HEENT exam.  Motor exam: Normal bulk, strength and tone is noted. There is no resting tremor. On Archimedes spiral drawing he has a moderate tremor with the right hand, mild to moderate tremor with the left hand and more insecurity with the left hand which is his nondominant hand.  Handwriting is legible, tremulous more so with cursive handwriting and more legible with print, not micrographic.  He has a mild postural tremor in both upper extremities, minimal to mild action tremor in both upper extremities, no lower extremity tremor, no intention tremor.   Fine motor skills and  coordination: Intact finger taps, hand movements and rapid alternating patting with both upper extremities, no decrement in amplitude, normal foot taps bilaterally.    Cerebellar testing: No dysmetria or intention tremor. There is no truncal or gait ataxia.  Normal finger-to-nose, normal heel-to-shin bilaterally.  Reflexes are 2+ throughout including ankles.  Sensory exam: intact to light touch in the upper and lower extremities.  Gait, station and balance: He stands easily. No veering to one side is noted. No leaning to one side is noted. Posture is age-appropriate and stance is narrow based. Gait shows normal stride length and normal pace. No problems turning are noted.  Preserved arm swing, no shuffling, tandem walk slightly challenging in the beginning but doable.  Assessment and Plan:  In summary, JAKOREY CULLETON is a very pleasant 62 y.o.-year old male with an underlying medical history of hypertension, hyperlipidemia, mitral valve prolapse, history of pneumonitis, seasonal allergies, tinnitus, history of heart murmur, chest pain, and overweight state, who presents for evaluation of his tremor disorder of several years duration, several decades in fact.  History and examination are supportive of underlying essential tremor.  Contributors may include caffeine, nervousness/anxiety, medication side effect from sertraline.  We talked about different triggers and alleviating factors.  He is reminded to stay well-hydrated and scale back on caffeine intake to limit himself to 1 or 2 servings per day.  He is advised to trial a beta-blocker, he has not tried any symptomatic treatment.  We talked about contraindications for beta-blocker treatment and limitations as well as possible common and some rare side effects.  He is advised that it can cause lightheadedness, dizziness, sleepiness, low heart rate and low blood pressure values.  He is advised to start slowly with a 20 mg pill of propranolol, once daily  for the first several days or up to a week or so he can also go longer if he likes.  He can gradually increase to up to 1 pill 3 times daily.  He was given written instructions and a printed version of his after visit summary.  He is advised to follow-up in this clinic in about 3 to 4 months to see one of our nurse practitioners.  I would recommend a check of his thyroid function with his next blood work through  your office.  He does not have any other focal neurological findings and no evidence of parkinsonism, I did not suggest any brain MRI at this time.  I answered all his questions today and he was in agreement with our plan.   Thank you very much for allowing me to participate in the care of this nice patient. If I can be of any further assistance to you please do not hesitate to call me at [phone].  Sincerely,   Star Age, MD, PhD

## 2022-06-06 NOTE — Patient Instructions (Signed)
You have a tremor of both hands, it is possible that you have a familial tremor or hereditary tremor, also known as essential tremor.  I do not see any signs or symptoms of parkinson's like disease or what we call parkinsonism.  For your tremor, I recommend a trial of a beta-blocker called Inderal (generic name: propranolol) 20 mg strength: take 1 pill each afternoon for 1 week, then 1 pill twice daily for 1-2 weeks, then 1 pill 3 times a day thereafter. Common side effect reported are: lethargy, sedation, low blood pressure and low pulse rate. Please monitor your BP and Pulse every few days, if your pulse drops lower than 55 you may feel bad and we may have to adjust your dose. Same with your BP below 110/55.   If you have good effect from the lower dose such as 1 pill once daily or 1 pill twice daily you can stay on that dose as well of course.  We can see you in 3-4 mo with the nurse practitioner.   Please remember, that any kind of tremor may be exacerbated by anxiety, anger, nervousness, excitement, dehydration, sleep deprivation, thyroid dysfunction, by caffeine, and low blood sugar values or blood sugar fluctuations. Some medications can exacerbate tremors, this includes certain asthma or COPD medications and certain antidepressants including sertraline.   Please try to limit your caffeine to 1 or 2 servings per day.  Try to continue to hydrate well with water.

## 2023-05-19 ENCOUNTER — Institutional Professional Consult (permissible substitution): Payer: 59 | Admitting: Neurology

## 2023-08-17 ENCOUNTER — Other Ambulatory Visit: Payer: Self-pay | Admitting: Neurology

## 2023-08-26 NOTE — Telephone Encounter (Signed)
 I called pt and he states he has been taking propranolol  20mg  po daily.  That controls the tremors.  I relayed he needs appt for us  to continue to refill medication. He will check with pcp to see if will refill other wise will call us  back.

## 2023-10-20 ENCOUNTER — Ambulatory Visit (HOSPITAL_COMMUNITY)
Admission: RE | Admit: 2023-10-20 | Discharge: 2023-10-20 | Disposition: A | Discharge status: Home / Self Care | Source: Ambulatory Visit | Attending: Internal Medicine | Admitting: Internal Medicine

## 2023-10-20 ENCOUNTER — Ambulatory Visit: Payer: Self-pay

## 2023-10-20 ENCOUNTER — Encounter (HOSPITAL_COMMUNITY): Payer: Self-pay

## 2023-10-20 VITALS — BP 135/92 | HR 74 | Temp 98.1°F | Resp 16

## 2023-10-20 DIAGNOSIS — L237 Allergic contact dermatitis due to plants, except food: Secondary | ICD-10-CM | POA: Diagnosis not present

## 2023-10-20 MED ORDER — PREDNISONE 20 MG PO TABS: 40.0000 mg | ORAL_TABLET | Freq: Every day | ORAL | 0 refills | Status: AC

## 2023-10-20 MED ORDER — TRIAMCINOLONE ACETONIDE 0.1 % EX CREA: 1.0000 | TOPICAL_CREAM | Freq: Two times a day (BID) | CUTANEOUS | 0 refills | Status: DC

## 2023-10-20 NOTE — Discharge Instructions (Addendum)
 Take prednisone  once daily for the next 5 days. This will be 2 pills (20mg  each) totaling 40mg  daily for 5 days each morning with breakfast.  Triamcinolone  cream (steroid) every 12 hours as needed for itching. Do not use this for longer than 10 days as this can cause skin thinning.   Avoid scratching rash.  Watch for signs of secondary bacterial infection as discussed such as new/worsening redness, swelling, pus, pain, or fever/chills.  If you develop any new or worsening symptoms or if your symptoms do not start to improve, please return here or follow-up with your primary care provider. If your symptoms are severe, please go to the emergency room.

## 2023-10-20 NOTE — ED Provider Notes (Signed)
 [facility]    CSN: [id number] Arrival date & time: 10/20/23  1432      History   Chief Complaint Chief Complaint  Patient presents with   appt 3    HPI Adam Chang is a 63 y.o. male.   Adam Chang is a 63 y.o. male presenting for chief complaint of poison ivy rash to the anterior left shin that started 8 days ago.   He was exposed to poison ivy in his yard a few days prior to symptom onset.  He was able to scrub the ankle that touched the poison ivy well, however rash still developed.Rash has now spread to the right ankle.  Rash is itchy, red, and with a few blisters to the left anterior ankle.  States blisters ruptured a few days ago with clear fluid.  Denies recent antibiotic or oral steroid use.  His neighbor had some betamethasone cream and he has been using this with significant improvement in rash prior to arrival.  Denies lesions to the arms, face, back/torso, and genitourinary area.     Past Medical History:  Diagnosis Date   Alveolar/parietoalveolar pneumonopathy, other    Chest pain    Heart murmur    Hypertension    Hypertriglyceridemia    Hypoxemia    MVP (mitral valve prolapse)    Pneumonitis, hypersensitivity ([facility]) 10/25/2010   Pneumonitis, hypersensitivity ([facility]) 10/25/2010   Seasonal allergies    Tinnitus of both ears     Patient Active Problem List   Diagnosis Date Noted   Back pain 08/16/2014   Hypogonadism male 02/23/2013   Cough 12/30/2011   Pneumonitis, hypersensitivity ([facility]) 10/25/2010   Pre-operative respiratory examination 10/25/2010   Acute sinusitis 08/10/2010   Chest pain 07/19/2010   Hypertriglyceridemia 07/19/2010   Hypoxemia 07/17/2010    Past Surgical History:  Procedure Laterality Date   BACK SURGERY     2023   SHOULDER SURGERY Right    2020 approx       Home Medications    Prior to Admission medications   Medication Sig Start Date End Date Taking? Authorizing Provider  predniSONE  (DELTASONE )  20 MG tablet Take 2 tablets (40 mg total) by mouth daily with breakfast for 5 days. 10/20/23 10/25/23 Yes Enedelia Dorna HERO, FNP  triamcinolone  cream (KENALOG ) 0.1 % Apply 1 Application topically 2 (two) times daily. 10/20/23  Yes Enedelia Dorna HERO, FNP  albuterol (VENTOLIN HFA) 108 (90 Base) MCG/ACT inhaler Inhale 2 puffs into the lungs every 6 (six) hours as needed. 05/29/22   [provider]  cetirizine (ZYRTEC) 10 MG tablet Take 10 mg by mouth daily. 05/08/22   [provider]  clomiPHENE  (CLOMID ) 50 MG tablet TAKE 1/4 TABLET BY MOUTH EVERY DAY 10/28/17   Kassie Mallick, MD  fluticasone  (FLONASE ) 50 MCG/ACT nasal spray Place 1 spray into both nostrils daily. 05/08/22   [provider]  losartan (COZAAR) 100 MG tablet Take 100 mg by mouth daily. 12/30/21   [provider]  montelukast (SINGULAIR) 10 MG tablet Take 10 mg by mouth at bedtime.    [provider]  Multiple Vitamins-Minerals (MULTIVITAMIN WITH MINERALS) tablet Take 1 tablet by mouth daily.      [provider]  propranolol  (INDERAL ) 20 MG tablet Take 1 tablet (20 mg total) by mouth 3 (three) times daily. Follow written instructions provided separately. 06/06/22   Athar, Saima, MD  sertraline (ZOLOFT) 100 MG tablet Take 100 mg by mouth daily. 10/30/21  [provider]    Family History Family History  Problem Relation Age of Onset   Stroke Father 43    Social History Social History   Tobacco Use   Smoking status: Former    Current packs/day: 0.00    Average packs/day: 0.5 packs/day for 8.0 years (4.0 ttl pk-yrs)    Types: Cigarettes    Start date: 04/22/1977    Quit date: 04/22/1985    Years since quitting: 38.5   Smokeless tobacco: Never  Vaping Use   Vaping status: Never Used  Substance Use Topics   Alcohol use: Yes    Alcohol/week: 2.0 standard drinks of alcohol    Types: 2 Shots of liquor per week    Comment: occaaional   Drug use: No     Allergies    Patient has no known allergies.   Review of Systems Review of Systems Per HPI  Physical Exam Triage Vital Signs ED Triage Vitals  Encounter Vitals Group     BP 10/20/23 1503 (!) 135/92     Girls Systolic BP Percentile --      Girls Diastolic BP Percentile --      Boys Systolic BP Percentile --      Boys Diastolic BP Percentile --      Pulse Rate 10/20/23 1503 74     Resp 10/20/23 1503 16     Temp 10/20/23 1503 98.1 F (36.7 C)     Temp Source 10/20/23 1503 Oral     SpO2 10/20/23 1503 96 %     Weight --      Height --      Head Circumference --      Peak Flow --      Pain Score 10/20/23 1502 0     Pain Loc --      Pain Education --      Exclude from Growth Chart --    No data found.  Updated Vital Signs BP (!) 135/92 (BP Location: Left Arm)   Pulse 74   Temp 98.1 F (36.7 C) (Oral)   Resp 16   SpO2 96%   Visual Acuity Right Eye Distance:   Left Eye Distance:   Bilateral Distance:    Right Eye Near:   Left Eye Near:    Bilateral Near:     Physical Exam Vitals and nursing note reviewed.  Constitutional:      Appearance: He is not ill-appearing or toxic-appearing.  HENT:     Head: Normocephalic and atraumatic.     Right Ear: Hearing and external ear normal.     Left Ear: Hearing and external ear normal.     Nose: Nose normal.     Mouth/Throat:     Lips: Pink.   Eyes:     General: Lids are normal. Vision grossly intact. Gaze aligned appropriately.     Extraocular Movements: Extraocular movements intact.     Conjunctiva/sclera: Conjunctivae normal.   Pulmonary:     Effort: Pulmonary effort is normal.   Musculoskeletal:     Cervical back: Neck supple.   Skin:    General: Skin is warm and dry.     Capillary Refill: Capillary refill takes less than 2 seconds.     Findings: Rash (Erythematous papulovesicular rash to the left anterior ankle, right posterior ankle, a few lesions to the right anterior ankle, and some lesions spreading up the left  anterior shin.  See images below.) present.   Neurological:     General:  No focal deficit present.     Mental Status: He is alert and oriented to person, place, and time. Mental status is at baseline.     Cranial Nerves: No dysarthria or facial asymmetry.   Psychiatric:        Mood and Affect: Mood normal.        Speech: Speech normal.        Behavior: Behavior normal.        Thought Content: Thought content normal.        Judgment: Judgment normal.    Right posterior ankle   Right anterior ankle   Left anterior shin/lower leg   Left anterior ankle    UC Treatments / Results  Labs (all labs ordered are listed, but only abnormal results are displayed) Labs Reviewed - No data to display  EKG   Radiology No results found.  Procedures Procedures (including critical care time)  Medications Ordered in UC Medications - No data to display  Initial Impression / Assessment and Plan / UC Course  I have reviewed the triage vital signs and the nursing notes.  Pertinent labs & imaging results that were available during my care of the patient were reviewed by me and considered in my medical decision making (see chart for details).   1.  Poison ivy dermatitis Presentation is consistent with contact dermatitis likely due to poison ivy. Will treat with prednisone  40 mg once daily for 5 days. Advised to take with food to avoid stomach upset. May use triamcinolone  cream twice daily for up to 10 days as needed. No signs of secondary bacterial infection on exam currently, however infection return precautions discussed.  Counseled patient on potential for adverse effects with medications prescribed/recommended today, strict ER and return-to-clinic precautions discussed, patient verbalized understanding.    Final Clinical Impressions(s) / UC Diagnoses   Final diagnoses:  Poison ivy dermatitis     Discharge Instructions      Take prednisone  once daily for the next 5 days.  This will be 2 pills (20mg  each) totaling 40mg  daily for 5 days each morning with breakfast.  Triamcinolone  cream (steroid) every 12 hours as needed for itching. Do not use this for longer than 10 days as this can cause skin thinning.   Avoid scratching rash.  Watch for signs of secondary bacterial infection as discussed such as new/worsening redness, swelling, pus, pain, or fever/chills.  If you develop any new or worsening symptoms or if your symptoms do not start to improve, please return here or follow-up with your primary care provider. If your symptoms are severe, please go to the emergency room.    ED Prescriptions     Medication Sig Dispense Auth. Provider   predniSONE  (DELTASONE ) 20 MG tablet Take 2 tablets (40 mg total) by mouth daily with breakfast for 5 days. 10 tablet Hiroto Saltzman M, FNP   triamcinolone  cream (KENALOG ) 0.1 % Apply 1 Application topically 2 (two) times daily. 30 g Enedelia Dorna HERO, FNP      PDMP not reviewed this encounter.   Enedelia Dorna HERO, [place] 10/20/23 1555

## 2023-10-20 NOTE — ED Triage Notes (Signed)
 Pt has rash on LLE for coup[le weeks.  Wants refill on Betamethasone cream.

## 2023-10-20 NOTE — Telephone Encounter (Signed)
 FYI Only or Action Required?: FYI only for provider.  Patient was last seen in primary care on not yet established. Called Nurse Triage reporting Poison Ivy. Symptoms began 5-6 days agoseveral days ago. Interventions attempted: OTC medications: Use friends prescription medication. Symptoms are: gradually improving.  Triage Disposition: See Physician Within 24 Hours  Patient/caregiver understands and will follow disposition?: Yes                  Copied from CRM [id number]. Topic: Clinical - Red Word Triage >> Oct 20, 2023 12:13 PM Brittney F wrote: [facility] that prompted transfer to Nurse Triage:   Redness and swelling at the sight of poison ivy reaction; Black and blue bruising appearance  Medication the patient has been taking is called betamethason dipropionate cream 5% (50 gram bottle); Patient got the prescription from a neighbor who had recently gone through a reaction to poison ivy himself Reason for Disposition  MODERATE to SEVERE itching (e.g., interferes with work, school, sleep, or other activities)  Answer Assessment - Initial Assessment Questions 1. APPEARANCE of RASH: Describe the rash.      Both feet  - swollen -  2. LOCATION: Where is the rash located?  (e.g., face, genitals, hands, legs)     Both feet - shins 3. SIZE: How large is the rash?      8X4 4. ONSET: When did the rash begin?      5-6 days  5. ITCHING: Does the rash itch? If Yes, ask: How bad is it?   - MILD - doesn't interfere with normal activities   - MODERATE-SEVERE: interferes with work, school, sleep, or other activities      mild 6. EXPOSURE:  How were you exposed to the plant (poison ivy, poison oak, sumac)  When were you exposed?       yes 7. PAST HISTORY: Have you had a poison ivy rash before? If Yes, ask: How bad was it?     yes  Protocols used: Poison Ivy - Oak - Sumac-A-AH

## 2023-11-06 ENCOUNTER — Ambulatory Visit (INDEPENDENT_AMBULATORY_CARE_PROVIDER_SITE_OTHER): Payer: Self-pay | Admitting: Orthopedic Surgery

## 2023-11-06 ENCOUNTER — Encounter: Payer: Self-pay | Admitting: Orthopedic Surgery

## 2023-11-06 VITALS — BP 120/70 | HR 93 | Temp 97.8°F | Resp 16 | Ht 69.0 in | Wt 186.0 lb

## 2023-11-06 DIAGNOSIS — E781 Pure hyperglyceridemia: Secondary | ICD-10-CM | POA: Diagnosis not present

## 2023-11-06 DIAGNOSIS — E291 Testicular hypofunction: Secondary | ICD-10-CM

## 2023-11-06 DIAGNOSIS — J302 Other seasonal allergic rhinitis: Secondary | ICD-10-CM

## 2023-11-06 DIAGNOSIS — F339 Major depressive disorder, recurrent, unspecified: Secondary | ICD-10-CM

## 2023-11-06 DIAGNOSIS — I10 Essential (primary) hypertension: Secondary | ICD-10-CM | POA: Diagnosis not present

## 2023-11-06 DIAGNOSIS — G250 Essential tremor: Secondary | ICD-10-CM

## 2023-11-06 MED ORDER — MONTELUKAST SODIUM 10 MG PO TABS: 10.0000 mg | ORAL_TABLET | Freq: Every morning | ORAL | 1 refills | Status: DC

## 2023-11-06 MED ORDER — CLOMIPHENE CITRATE 50 MG PO TABS
50.0000 mg | ORAL_TABLET | Freq: Every day | ORAL | 0 refills | Status: AC
Start: 2023-11-06 — End: ?

## 2023-11-06 NOTE — Progress Notes (Signed)
 Careteam: Patient Care Team: Sophronia Ozell BROCKS, MD as PCP - General (Family Medicine) Geronimo Amel, MD as Consulting Physician (Pulmonary Disease)  Seen by: Greig Cluster, AGNP-C  PLACE OF SERVICE:  [facility]  Advanced Directive information Does Patient Have a Medical Advance Directive?: Yes, Type of Advance Directive: Healthcare Power of [place];Living will;Out of facility DNR (pink MOST or yellow form), Does patient want to make changes to medical advance directive?: No - Patient declined  No Known Allergies  Chief Complaint  Patient presents with   Establish Care    New patient.    Poison Ivy    Patient complains of poison ivy.      HPI: Patient is a 63 y.o. male seen today to establish as new patient.   Previous provider was with [facility] [facility]. Lived in [place] 30 years. Married. 5 children. 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   No MI, T2DM, cancer. H/o pre skin cancer> followed by dermatology.   No recent hospitalizations or injuries.   HTN- remains on losartan  Elevated triglycerides- Tricor disocntinued 10 years ago  Essential tremor- remains on propranolol  prn  Depression/anxiety- no mod changes, remains on Zoloft  Low testosterone - seen by Dr. Kassie ( endocrinology), remain on Clomid    Sugrery: Chronic lower back pain> past surgery with Dr. Beuford 2023> not on daily medication> daily strethcing and exercise   Right shoulder surgery in 2020  Colonoscopy> 1 year ago > Dr. Belvie Campi  Low oxygenation> past lung infection> past biopsy> admits to smoking about 20 years> about 1PPD  No h/o alcohol or drug abuse.  Diet: eats 2 meals daily> follows low carb/ sugar/intermittent fasting Exercise: 3x/week for 45-1 hour  Dental: dental cleanings every 6 months Eye exam: [facility]   Has HPOA/living will   Review of Systems:  Review of Systems  Constitutional: Negative.   HENT: Negative.    Respiratory: Negative.     Cardiovascular: Negative.   Gastrointestinal: Negative.   Genitourinary: Negative.   Musculoskeletal: Negative.   Skin: Negative.   Neurological:  Positive for tremors.  Endo/Heme/Allergies:  Positive for environmental allergies.  Psychiatric/Behavioral:  Positive for depression.     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 His smoking use included cigarettes. He started smoking about 46 years ago. He has a 4 pack-year smoking history. He has never used smokeless tobacco. He reports current alcohol use of about 2.0 standard drinks of alcohol per week. He reports that he does not use drugs.  Family History  Problem Relation Age of Onset   Stroke Father 36    Medications: Patient's Medications  New Prescriptions   No medications on file  Previous Medications   ACETAMINOPHEN-CAFF-PYRILAMINE (MIDOL COMPLETE) 500-60-15 MG TABS    Take 1 tablet by mouth as needed.   ALOE VERA PO    Take 1 Piece by mouth daily. SOFTGEL   CLOMIPHENE  (  CLOMID ) 50 MG TABLET    TAKE 1/4 TABLET BY MOUTH EVERY DAY   LOSARTAN (COZAAR) 100 MG TABLET    Take 100 mg by mouth daily.   MISC NATURAL PRODUCTS (OSTEO BI-FLEX ADV TRIPLE ST) TABS    Take 1 tablet by mouth daily.   MONTELUKAST  (SINGULAIR ) 10 MG TABLET    Take 10 mg by mouth every morning.   MULTIPLE VITAMIN (ONE-A-DAY MENS PO)    Take 1 tablet by mouth daily.   OXYMETAZOLINE (AFRIN) 0.05 % NASAL SPRAY    Place 1 spray into both nostrils at bedtime.    PROPRANOLOL  (INDERAL ) 20 MG TABLET    Take 1 tablet (20 mg total) by mouth 3 (three) times daily. Follow written instructions provided separately.   PROPRANOLOL  (INDERAL ) 20 MG TABLET    Take 20-40 mg by mouth as needed.   SERTRALINE (ZOLOFT) 50 MG TABLET    Take 50 mg by mouth daily.  Modified Medications   No medications on file  Discontinued Medications   ALBUTEROL (VENTOLIN HFA) 108 (90 BASE) MCG/ACT INHALER    Inhale 2 puffs into the lungs every 6 (six) hours as needed.   CETIRIZINE (ZYRTEC) 10 MG TABLET    Take 10 mg by mouth daily.   FLUTICASONE  (FLONASE ) 50 MCG/ACT NASAL SPRAY    Place 1 spray into both nostrils daily.   MULTIPLE VITAMINS-MINERALS (MULTIVITAMIN WITH MINERALS) TABLET    Take 1 tablet by mouth daily.     SERTRALINE (ZOLOFT) 100 MG TABLET    Take 100 mg by mouth daily.   TRIAMCINOLONE  CREAM (KENALOG ) 0.1 %    Apply 1 Application topically 2 (two) times daily.    Physical Exam:  Vitals:   11/06/23 1356  BP: 120/70  Pulse: 93  Resp: 16  Temp: 97.8 F (36.6 C)  SpO2: 92%  Weight: 186 lb (84.4 kg)  Height: 5' 9 (1.753 m)   Body mass index is 27.47 kg/m. Wt Readings from Last 3 Encounters:  11/06/23 186 lb (84.4 kg)  06/06/22 198 lb 6.4 oz (90 kg)  02/24/15 193 lb (87.5 kg)    Physical Exam Vitals reviewed.  Constitutional:      General: He is not in acute distress. HENT:     Head: Normocephalic.  Eyes:     General:        Right eye: No discharge.        Left eye: No discharge.  Cardiovascular:     Rate and Rhythm: Normal rate and regular rhythm.     Pulses: Normal pulses.     Heart sounds: Normal heart sounds.  Pulmonary:     Effort: Pulmonary effort is normal.     Breath sounds: Normal breath sounds.  Abdominal:     General: Bowel sounds are normal.     Palpations: Abdomen is soft.  Musculoskeletal:     Cervical back: Neck supple.     Right lower leg: No edema.     Left lower leg: No edema.  Skin:    General: Skin is warm.     Capillary  Refill: Capillary refill takes less than 2 seconds.  Neurological:     General: No focal deficit present.     Mental Status: He is alert and oriented to person, place, and time.  Psychiatric:        Mood and Affect: Mood normal.     Labs reviewed: Basic Metabolic Panel: No results for input(s): NA, K, CL, CO2, GLUCOSE, BUN, CREATININE,  CALCIUM , MG, PHOS, TSH in the last 8760 hours. Liver Function Tests: No results for input(s): AST, ALT, ALKPHOS, BILITOT, PROT, ALBUMIN in the last 8760 hours. No results for input(s): LIPASE, AMYLASE in the last 8760 hours. No results for input(s): AMMONIA in the last 8760 hours. CBC: No results for input(s): WBC, NEUTROABS, HGB, HCT, MCV, PLT in the last 8760 hours. Lipid Panel: No results for input(s): CHOL, HDL, LDLCALC, TRIG, CHOLHDL, LDLDIRECT in the last 8760 hours. TSH: No results for input(s): TSH in the last 8760 hours. A1C: No results found for: HGBA1C   Assessment/Plan 1. Hypogonadism male (Primary) - followed by Dr. Kassie in past - recheck testosterone  level> if abnormal> new referral to endocrinology - clomiPHENE  (CLOMID ) 50 MG tablet; Take 1 tablet (50 mg total) by mouth daily.  Dispense: 30 tablet; Refill: 0 - Testosterone ; Future  2. Seasonal allergic rhinitis, unspecified trigger - montelukast  (SINGULAIR ) 10 MG tablet; Take 1 tablet (10 mg total) by mouth every morning.  Dispense: 90 tablet; Refill: 1  3. Primary hypertension - controlled, goal < 150/90 - cont losartan - CBC with Differential/Platelet; Future - Complete Metabolic Panel with eGFR; Future  4. Hypertriglyceridemia - triglycerides 649 (2013), 505 (2015), 392 (2016) - Lipid Panel; Future  5. Essential tremor - stable with propranolol  prn  6. Recurrent depression (HCC) - no mood changes - cont Zoloft  Total time: 46 minutes. Greater than 50% of total time spent doing patient education  regarding health maintenance, HTN, elevated triglycerides, low testosterone  and depression including symptom/medication management.     Next appt: 12/04/2023  Greig Cluster, ELNITA  [facility] [phone]

## 2023-11-06 NOTE — Patient Instructions (Signed)
 Please schedule fasting lab visit > no food 12 hours prior to lab draw> water and black coffee are ok

## 2023-12-01 ENCOUNTER — Other Ambulatory Visit

## 2023-12-01 ENCOUNTER — Other Ambulatory Visit: Payer: Self-pay

## 2023-12-01 DIAGNOSIS — E291 Testicular hypofunction: Secondary | ICD-10-CM

## 2023-12-01 DIAGNOSIS — I10 Essential (primary) hypertension: Secondary | ICD-10-CM

## 2023-12-01 DIAGNOSIS — E781 Pure hyperglyceridemia: Secondary | ICD-10-CM

## 2023-12-01 LAB — CBC WITH DIFFERENTIAL/PLATELET
Absolute Lymphocytes: 1853 {cells}/uL (ref 850–3900)
Absolute Monocytes: 590 {cells}/uL (ref 200–950)
Basophils Absolute: 89 {cells}/uL (ref 0–200)
Basophils Relative: 1.5 %
Eosinophils Absolute: 260 {cells}/uL (ref 15–500)
Eosinophils Relative: 4.4 %
HCT: 46.5 % (ref 38.5–50.0)
Hemoglobin: 14.9 g/dL (ref 13.2–17.1)
MCH: 29.3 pg (ref 27.0–33.0)
MCHC: 32 g/dL (ref 32.0–36.0)
MCV: 91.4 fL (ref 80.0–100.0)
MPV: 9.8 fL (ref 7.5–12.5)
Monocytes Relative: 10 %
Neutro Abs: 3109 {cells}/uL (ref 1500–7800)
Neutrophils Relative %: 52.7 %
Platelets: 173 Thousand/uL (ref 140–400)
RBC: 5.09 Million/uL (ref 4.20–5.80)
RDW: 13.3 % (ref 11.0–15.0)
Total Lymphocyte: 31.4 %
WBC: 5.9 Thousand/uL (ref 3.8–10.8)

## 2023-12-01 LAB — LIPID PANEL
Cholesterol: 180 mg/dL (ref <200)
HDL: 32 mg/dL — ABNORMAL LOW (ref >=40)
LDL Cholesterol (Calc): 106 mg/dL — ABNORMAL HIGH
Non-HDL Cholesterol (Calc): 148 mg/dL — ABNORMAL HIGH (ref <130)
Total CHOL/HDL Ratio: 5.6 (calc) — ABNORMAL HIGH (ref <5.0)
Triglycerides: 312 mg/dL — ABNORMAL HIGH (ref <150)

## 2023-12-01 LAB — TESTOSTERONE: Testosterone: 892 ng/dL — ABNORMAL HIGH (ref 250–827)

## 2023-12-01 LAB — COMPREHENSIVE METABOLIC PANEL WITH GFR
AG Ratio: 2.4 (calc) (ref 1.0–2.5)
ALT: 20 U/L (ref 9–46)
AST: 19 U/L (ref 10–35)
Albumin: 4.8 g/dL (ref 3.6–5.1)
Alkaline phosphatase (APISO): 45 U/L (ref 35–144)
BUN/Creatinine Ratio: 13 (calc) (ref 6–22)
BUN: 18 mg/dL (ref 7–25)
CO2: 27 mmol/L (ref 20–32)
Calcium: 9.3 mg/dL (ref 8.6–10.3)
Chloride: 104 mmol/L (ref 98–110)
Creat: 1.36 mg/dL — ABNORMAL HIGH (ref 0.70–1.35)
Globulin: 2 g/dL (ref 1.9–3.7)
Glucose, Bld: 89 mg/dL (ref 65–99)
Potassium: 4.4 mmol/L (ref 3.5–5.3)
Sodium: 139 mmol/L (ref 135–146)
Total Bilirubin: 0.7 mg/dL (ref 0.2–1.2)
Total Protein: 6.8 g/dL (ref 6.1–8.1)
eGFR: 58 mL/min/1.73m2 — ABNORMAL LOW (ref >=60)

## 2023-12-02 ENCOUNTER — Ambulatory Visit: Payer: Self-pay | Admitting: Orthopedic Surgery

## 2023-12-02 DIAGNOSIS — E781 Pure hyperglyceridemia: Secondary | ICD-10-CM

## 2023-12-02 MED ORDER — FENOFIBRATE 145 MG PO TABS: 145.0000 mg | ORAL_TABLET | Freq: Every day | ORAL | 1 refills | Status: DC

## 2023-12-04 ENCOUNTER — Ambulatory Visit (INDEPENDENT_AMBULATORY_CARE_PROVIDER_SITE_OTHER): Admitting: Orthopedic Surgery

## 2023-12-04 ENCOUNTER — Encounter: Payer: Self-pay | Admitting: Orthopedic Surgery

## 2023-12-04 VITALS — BP 132/88 | HR 71 | Temp 97.3°F | Resp 16 | Ht 69.0 in | Wt 188.0 lb

## 2023-12-04 DIAGNOSIS — Z125 Encounter for screening for malignant neoplasm of prostate: Secondary | ICD-10-CM | POA: Diagnosis not present

## 2023-12-04 DIAGNOSIS — I10 Essential (primary) hypertension: Secondary | ICD-10-CM

## 2023-12-04 DIAGNOSIS — E291 Testicular hypofunction: Secondary | ICD-10-CM | POA: Diagnosis not present

## 2023-12-04 DIAGNOSIS — N1831 Chronic kidney disease, stage 3a: Secondary | ICD-10-CM | POA: Diagnosis not present

## 2023-12-04 NOTE — Progress Notes (Signed)
 Careteam: Patient Care Team: Gil Greig BRAVO, NP as PCP - General (Adult Health Nurse Practitioner) Geronimo Amel, MD as Consulting Physician (Pulmonary Disease)  Seen by: Greig Gil, AGNP-C  PLACE OF SERVICE:  [facility]  Advanced Directive information Does Patient Have a Medical Advance Directive?: Yes, Type of Advance Directive: Healthcare Power of [place];Living will;Out of facility DNR (pink MOST or yellow form), Does patient want to make changes to medical advance directive?: No - Patient declined  No Known Allergies  Chief Complaint  Patient presents with   Follow-up    4 week follow up and discuss labs.     HPI: Patient is a 63 y.o. male seen today for medical management of chronic conditions.   Discussed the use of AI scribe software for clinical note transcription with the patient, who gave verbal consent to proceed.  History of Present Illness    Recent testosterone  892. He was advised to reduce clomid  intake to 5x/week. He has reduced the frequency of Clomid  to three times a week after discussing with his wife and is considering stopping it altogether. He is exploring options for an endocrinology referral for further management. We discussed cardiovascular risks associated with age and elevated testosterone .   He has a history of elevated triglycerides, with levels noted to be above 300. He consumes alcohol only on Saturdays and limits sugar and bread intake.  Recent lab results showed a creatinine level of 1.36 and a GFR of 58. He has a history of elevated creatinine levels, with similar results from a year ago. He is currently taking losartan for blood pressure management. 12/10/2022 BUN 20,Creat 1.34,GFR 59  He experiences tinnitus, described as a constant ringing in the ears, which has not improved with antihistamines like Zyrtec or Claritin. He has tried hearing aids in the past but found them unsatisfactory due to increased ambient noise.  He follows a  healthy diet, typically consuming boiled [place] and salads with various vegetables, avocados, and salmon. He avoids red meat due to his wife's dietary preferences and has replaced it with [place] burger. He rarely consumes potatoes and uses cauliflower as a substitute for pizza dough. No smoking. Alcohol consumption is limited to Saturdays. He reports frequent urination and is concerned about the interaction of his medications.       Review of Systems:  Review of Systems  Constitutional: Negative.   HENT:  Positive for tinnitus.   Eyes: Negative.   Respiratory: Negative.    Cardiovascular: Negative.   Gastrointestinal: Negative.   Genitourinary: Negative.   Musculoskeletal:  Positive for joint pain.  Skin: Negative.   Neurological: Negative.   Psychiatric/Behavioral: Negative.      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 His smoking use included cigarettes. He started smoking about 46 years ago. He has a 4 pack-year smoking history. He has never used smokeless tobacco. He reports current alcohol use of about  2.0 standard drinks of alcohol per week. He reports that he does not use drugs.  Family History  Problem Relation Age of Onset   Stroke Father 33    Medications: Patient's Medications  New Prescriptions   No medications on file  Previous Medications   ACETAMINOPHEN-CAFF-PYRILAMINE (MIDOL COMPLETE) 500-60-15 MG TABS    Take 1 tablet by mouth as needed.   ALOE VERA PO    Take 1  Piece by mouth daily. SOFTGEL   CLOMIPHENE  (CLOMID ) 50 MG TABLET    Take 1 tablet (50 mg total) by mouth daily.   FENOFIBRATE  (TRICOR ) 145 MG TABLET    Take 1 tablet (145 mg total) by mouth daily.   LOSARTAN (COZAAR) 100 MG TABLET    Take 100 mg by mouth daily.   MISC NATURAL PRODUCTS (OSTEO BI-FLEX ADV TRIPLE ST) TABS    Take 1 tablet by mouth daily.   MONTELUKAST  (SINGULAIR ) 10 MG TABLET    Take 1 tablet (10 mg total) by mouth every morning.   MULTIPLE VITAMIN (ONE-A-DAY MENS PO)    Take 1 tablet by mouth daily.   OXYMETAZOLINE (AFRIN) 0.05 % NASAL SPRAY    Place 1 spray into both nostrils at bedtime.   PROPRANOLOL  (INDERAL ) 20 MG TABLET    Take 20-40 mg by mouth as needed.   SERTRALINE (ZOLOFT) 50 MG TABLET    Take 50 mg by mouth daily.  Modified Medications   No medications on file  Discontinued Medications   No medications on file    Physical Exam:  Vitals:   12/04/23 1423  BP: 132/88  Pulse: 71  Resp: 16  Temp: (!) 97.3 F (36.3 C)  SpO2: 94%  Weight: 188 lb (85.3 kg)  Height: 5' 9 (1.753 m)   Body mass index is 27.76 kg/m. Wt Readings from Last 3 Encounters:  12/04/23 188 lb (85.3 kg)  11/06/23 186 lb (84.4 kg)  06/06/22 198 lb 6.4 oz (90 kg)    Physical Exam Vitals reviewed.  Constitutional:      General: He is not in acute distress. HENT:     Head: Normocephalic.     Nose: Nose normal.     Mouth/Throat:     Mouth: Mucous membranes are moist.  Eyes:     General:        Right eye: No discharge.        Left eye: No discharge.  Neck:     Vascular: No carotid bruit.  Cardiovascular:     Rate and Rhythm: Normal rate and regular rhythm.     Pulses: Normal pulses.     Heart sounds: Normal heart sounds.  Pulmonary:     Effort: Pulmonary effort is normal.     Breath sounds: Normal breath sounds.  Abdominal:     General: Bowel sounds are normal.  Musculoskeletal:     Cervical back: Neck supple.     Right lower leg: No edema.     Left lower leg: No edema.   Lymphadenopathy:     Cervical: No cervical adenopathy.  Skin:    General: Skin is warm.     Capillary Refill: Capillary refill takes less than 2 seconds.  Neurological:     General: No focal deficit present.     Mental Status: He is alert and oriented to person, place, and time.  Psychiatric:        Mood and Affect: Mood normal.     Labs reviewed: Basic Metabolic Panel: Recent Labs  12/01/23 0851  NA 139  K 4.4  CL 104  CO2 27  GLUCOSE 89  BUN 18  CREATININE 1.36*  CALCIUM  9.3   Liver Function Tests: Recent Labs    12/01/23 0851  AST 19  ALT 20  BILITOT 0.7  PROT 6.8   No results for input(s): LIPASE, AMYLASE in the last 8760 hours. No results for input(s): AMMONIA in the last 8760 hours. CBC: Recent Labs    12/01/23 0851  WBC 5.9  NEUTROABS 3,109  HGB 14.9  HCT 46.5  MCV 91.4  PLT 173   Lipid Panel: Recent Labs    12/01/23 0851  CHOL 180  HDL 32*  LDLCALC 106*  TRIG 312*  CHOLHDL 5.6*   TSH: No results for input(s): TSH in the last 8760 hours. A1C: No results found for: HGBA1C   Assessment/Plan 1. Primary hypertension (Primary) - controlled, goal < 140/80 - ? Losartan causing declining GFR - drug holiday of losartan> check blood pressures twice daily - discussed symptoms of hypertensive crisis> contact PCP - repeat bmp in 2-3 weeks  - if pressures not at goal, recommend switching to amlodipine  2. Hypogonadism male - testosterone  63 - patient considering stopping clomid  and rechecking levels - if patient chooses to continue Clomid , recommend referral to endocrinology - Testosterone ; Future  3. Stage 3a chronic kidney disease (HCC) - BUN/creat 18/1.36, GFR 58 - was BUN 20,Creat 1.34,GFR 59 12/10/2022 - admits to drinking water well - avoids NSAIDS - see above - Basic Metabolic Panel with eGFR; Future  4. Prostate cancer screening - PSA; Future  Total time: 38 minutes. Greater than 50% of total time spent doing  patient education regarding HTN, elevated testosterone , CKD and screening labs including symptom/medication management.     Next appt: Visit date not found  Clements Toro Gil BODILY  [facility] Adult Medicine [phone]

## 2023-12-04 NOTE — Patient Instructions (Addendum)
 Stop losartan  Please take blood pressure twice daily x 2 weeks > take first thing in the morning and one time in evening when you are relaxed   Please contact me immediately if you have chest pain, shortness of breath, blurred vision, headaches, or blood pressures > 160-180  Schedule lab visit once you have not taken losartan in 2-3 weeks  Continue to hydrate

## 2023-12-11 ENCOUNTER — Encounter: Payer: Self-pay | Admitting: Orthopedic Surgery

## 2023-12-12 NOTE — Telephone Encounter (Signed)
Message routed to PCP [place], Amy E, NP  

## 2023-12-19 ENCOUNTER — Encounter: Payer: Self-pay | Admitting: Orthopedic Surgery

## 2023-12-19 NOTE — Telephone Encounter (Signed)
 Message routed to PCP Gil Greig BRAVO, NP as Adam Chang. Message also routed to admin staff to call and schedule patient for lab appointment. He can have this done sometime next week if possible.

## 2023-12-19 NOTE — Telephone Encounter (Signed)
 Chang, Adam to Me  (Selected Message) AM    12/19/23  1:01 PM The patient was left a detailed message asking to call back to be scheduled for a lab appointment sometime next week.

## 2023-12-19 NOTE — Telephone Encounter (Signed)
 Patient has scheduled lab. Message routed to admin staff [email or web address] as RICK.

## 2023-12-19 NOTE — Telephone Encounter (Signed)
Message routed to PCP [place], Amy E, NP  

## 2023-12-25 ENCOUNTER — Other Ambulatory Visit

## 2023-12-25 DIAGNOSIS — Z125 Encounter for screening for malignant neoplasm of prostate: Secondary | ICD-10-CM

## 2023-12-25 DIAGNOSIS — N1831 Chronic kidney disease, stage 3a: Secondary | ICD-10-CM

## 2023-12-25 DIAGNOSIS — E291 Testicular hypofunction: Secondary | ICD-10-CM

## 2023-12-26 ENCOUNTER — Ambulatory Visit: Payer: Self-pay | Admitting: Orthopedic Surgery

## 2023-12-26 LAB — BASIC METABOLIC PANEL WITHOUT GFR
BUN: 20 mg/dL (ref 7–25)
CO2: 29 mmol/L (ref 20–32)
Calcium: 9.7 mg/dL (ref 8.6–10.3)
Chloride: 106 mmol/L (ref 98–110)
Creat: 1.28 mg/dL (ref 0.70–1.35)
Glucose, Bld: 88 mg/dL (ref 65–99)
Potassium: 5.2 mmol/L (ref 3.5–5.3)
Sodium: 142 mmol/L (ref 135–146)

## 2023-12-26 LAB — PSA: PSA: 0.36 ng/mL (ref <=4.00)

## 2023-12-26 LAB — TESTOSTERONE: Testosterone: 603 ng/dL (ref 250–827)

## 2024-02-23 ENCOUNTER — Encounter: Payer: Self-pay | Admitting: Orthopedic Surgery

## 2024-03-25 ENCOUNTER — Other Ambulatory Visit: Payer: Self-pay | Admitting: Orthopedic Surgery

## 2024-03-25 DIAGNOSIS — J302 Other seasonal allergic rhinitis: Secondary | ICD-10-CM

## 2024-03-29 ENCOUNTER — Other Ambulatory Visit: Payer: Self-pay | Admitting: Orthopedic Surgery

## 2024-03-29 ENCOUNTER — Encounter: Payer: Self-pay | Admitting: Orthopedic Surgery

## 2024-03-29 DIAGNOSIS — R296 Repeated falls: Secondary | ICD-10-CM

## 2024-05-20 ENCOUNTER — Encounter: Payer: Self-pay | Admitting: Orthopedic Surgery

## 2024-05-20 NOTE — Telephone Encounter (Signed)
 Patient is taking medication as needed, please advise on dispense number

## 2024-05-21 ENCOUNTER — Other Ambulatory Visit: Payer: Self-pay | Admitting: Orthopedic Surgery

## 2024-05-21 MED ORDER — PROPRANOLOL HCL 20 MG PO TABS: 20.0000 mg | ORAL_TABLET | ORAL | 1 refills | Status: AC | PRN
